# Patient Record
Sex: Female | Born: 1980 | Race: Black or African American | Hispanic: No | Marital: Single | Smoking: Never smoker
Health system: Southern US, Community
[De-identification: ages and names within clinical notes are randomized; demographics above are authoritative.]

## PROBLEM LIST (undated history)

## (undated) HISTORY — PX: NO PAST SURGERIES: SHX2092

---

## 2007-03-30 ENCOUNTER — Emergency Department (HOSPITAL_COMMUNITY): Admission: EM | Admit: 2007-03-30 | Discharge: 2007-03-30 | Payer: Self-pay | Admitting: Emergency Medicine

## 2007-04-15 ENCOUNTER — Ambulatory Visit (HOSPITAL_COMMUNITY): Admission: RE | Admit: 2007-04-15 | Discharge: 2007-04-15 | Payer: Self-pay | Admitting: Obstetrics & Gynecology

## 2007-05-27 ENCOUNTER — Ambulatory Visit (HOSPITAL_COMMUNITY): Admission: RE | Admit: 2007-05-27 | Discharge: 2007-05-27 | Payer: Self-pay | Admitting: Obstetrics & Gynecology

## 2007-08-24 ENCOUNTER — Ambulatory Visit (HOSPITAL_COMMUNITY): Admission: RE | Admit: 2007-08-24 | Discharge: 2007-08-24 | Payer: Self-pay | Admitting: Obstetrics

## 2007-10-25 ENCOUNTER — Inpatient Hospital Stay (HOSPITAL_COMMUNITY): Admission: AD | Admit: 2007-10-25 | Discharge: 2007-10-29 | Payer: Self-pay | Admitting: Obstetrics & Gynecology

## 2007-10-26 ENCOUNTER — Encounter (INDEPENDENT_AMBULATORY_CARE_PROVIDER_SITE_OTHER): Payer: Self-pay | Admitting: Obstetrics

## 2010-07-17 ENCOUNTER — Encounter: Admission: RE | Admit: 2010-07-17 | Discharge: 2010-07-17 | Payer: Self-pay | Admitting: Occupational Medicine

## 2010-08-28 ENCOUNTER — Emergency Department (HOSPITAL_COMMUNITY): Admission: EM | Admit: 2010-08-28 | Discharge: 2010-08-28 | Payer: Self-pay | Admitting: Emergency Medicine

## 2011-02-22 IMAGING — CR DG FOOT COMPLETE 3+V*L*
3 series · 3 of 3 positions shown · non-contrast
Comparison: None.

CLINICAL DATA: Fall with left foot pain.

LEFT FOOT - COMPLETE 3+ VIEW

[view not recorded (1 of 3)]
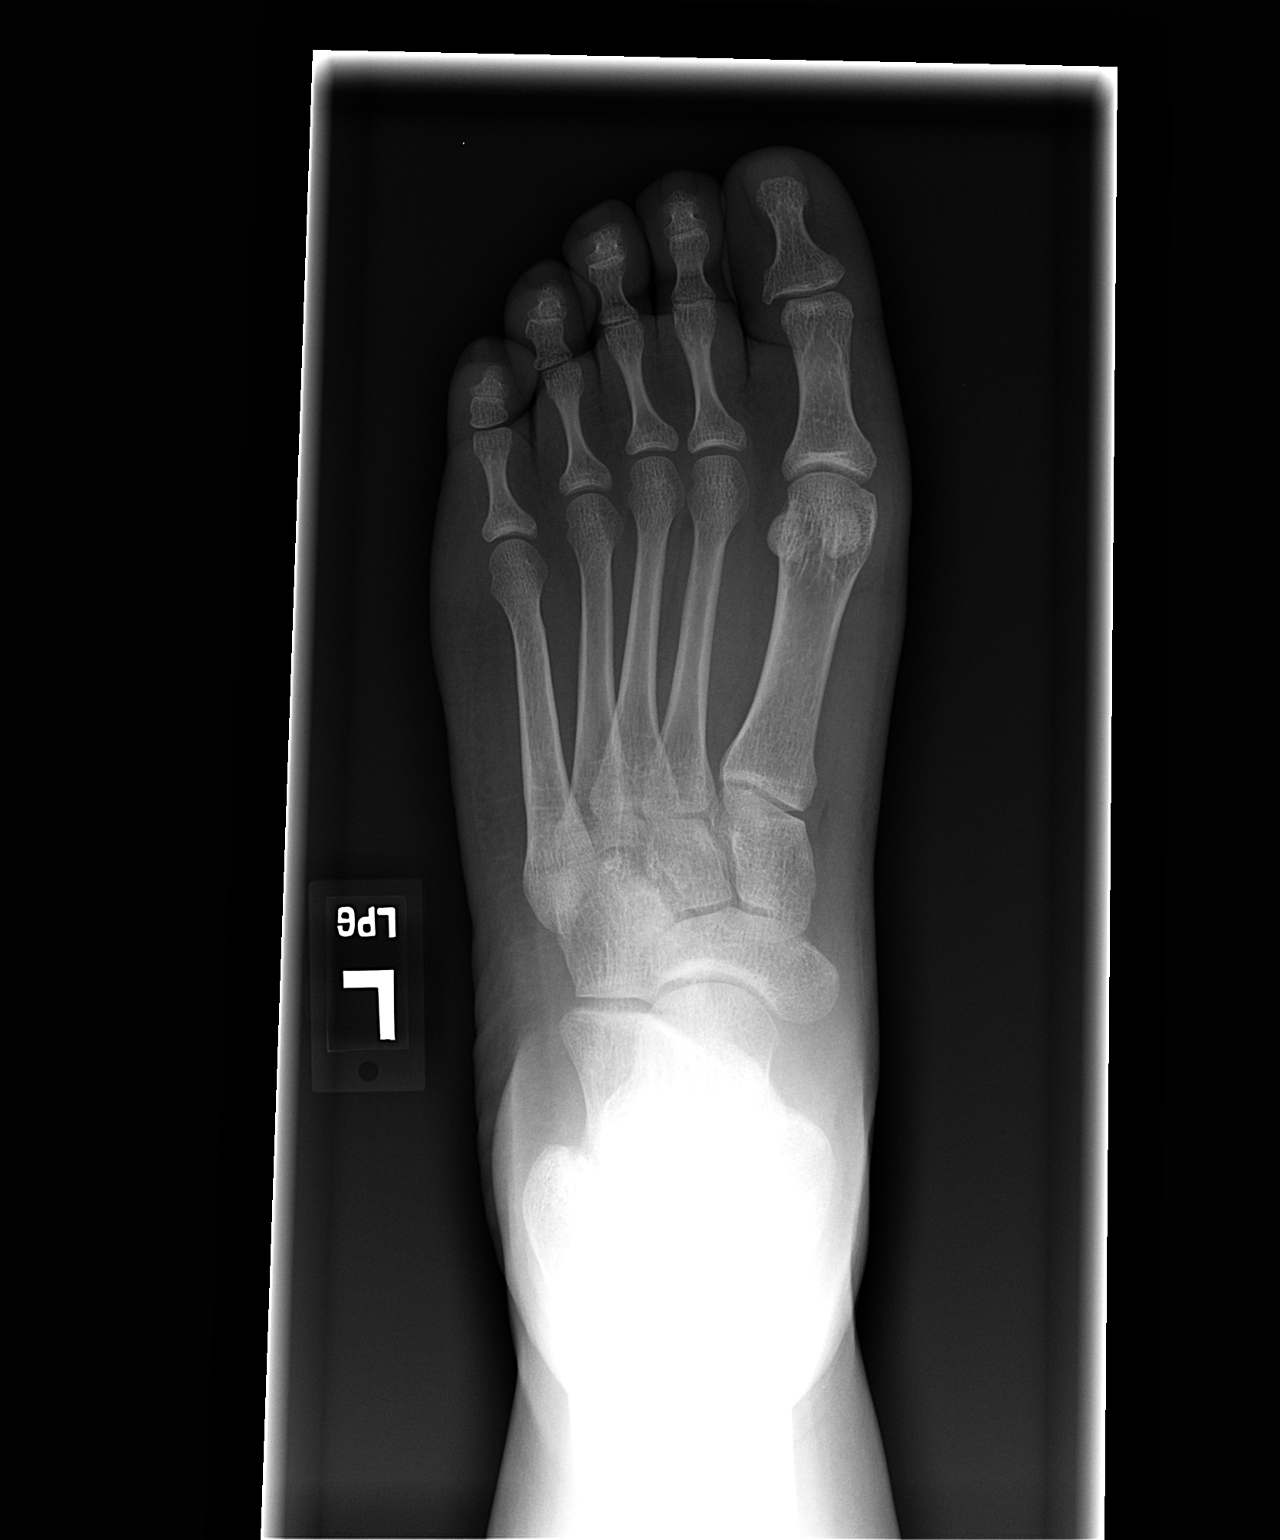

[view not recorded (2 of 3)]
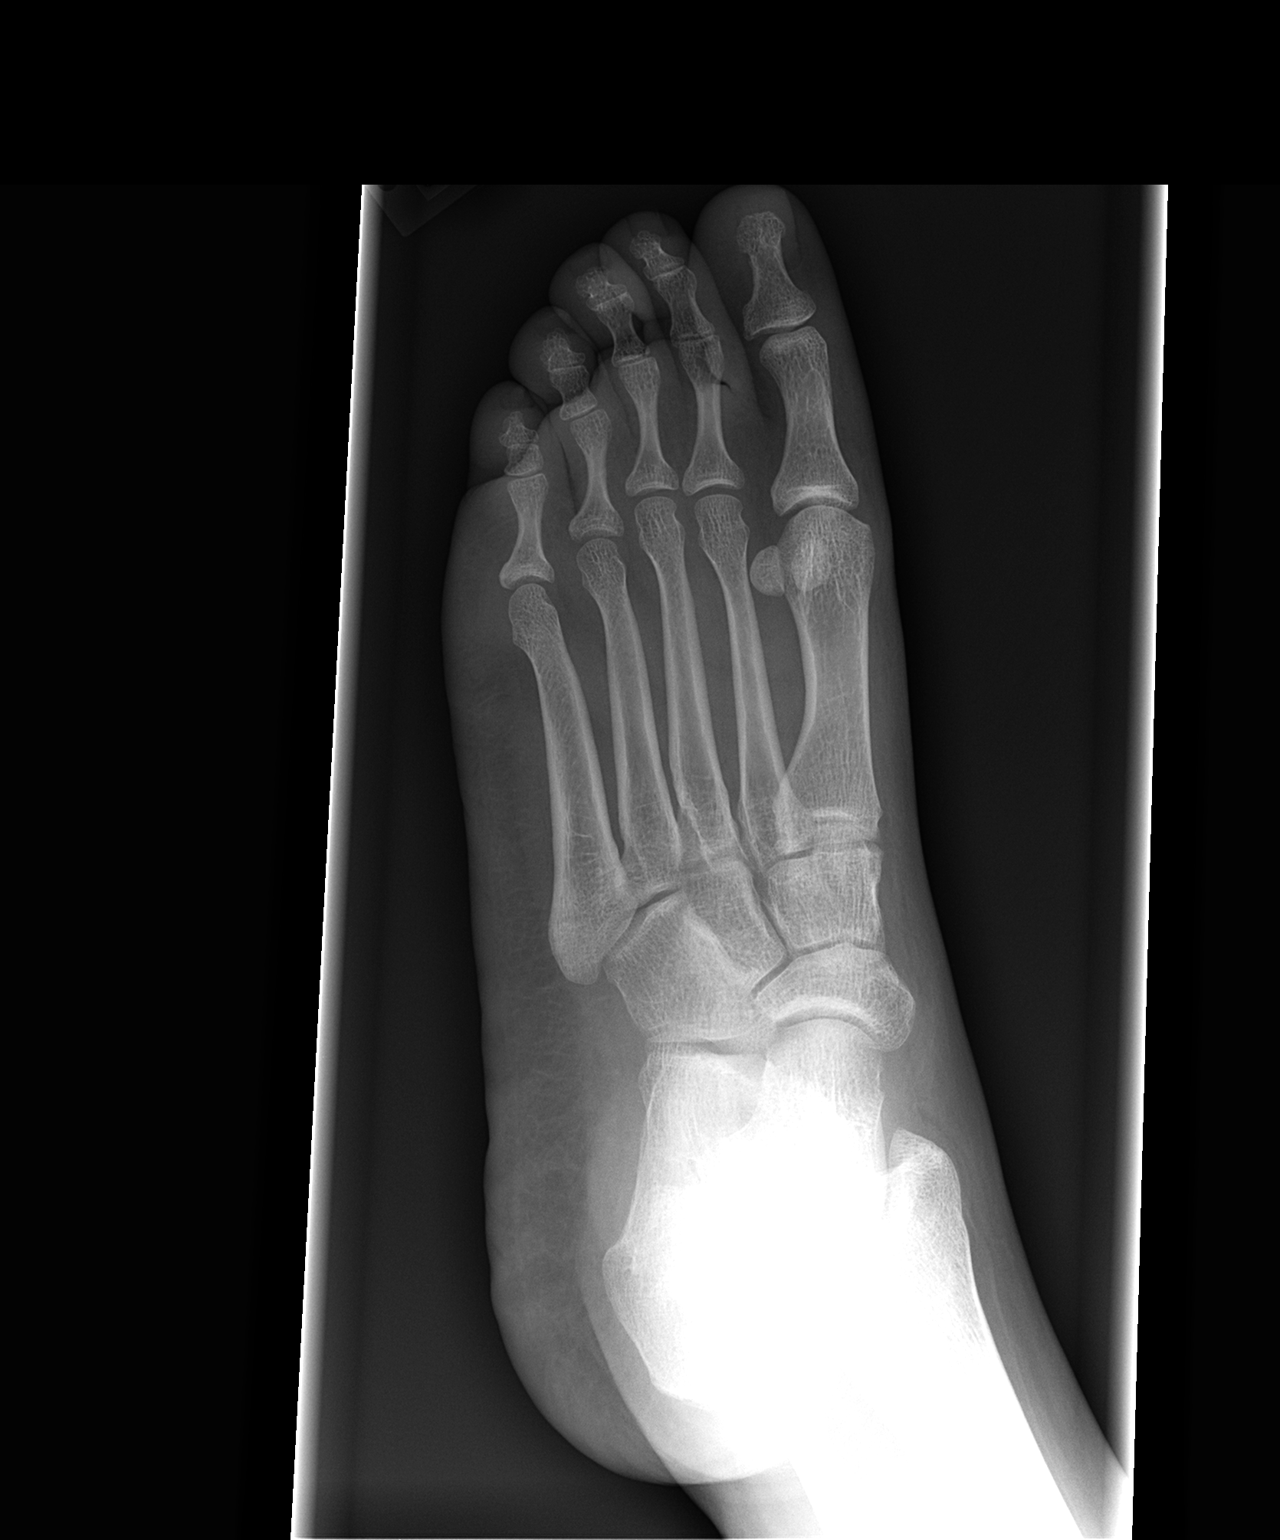

[view not recorded (3 of 3)]
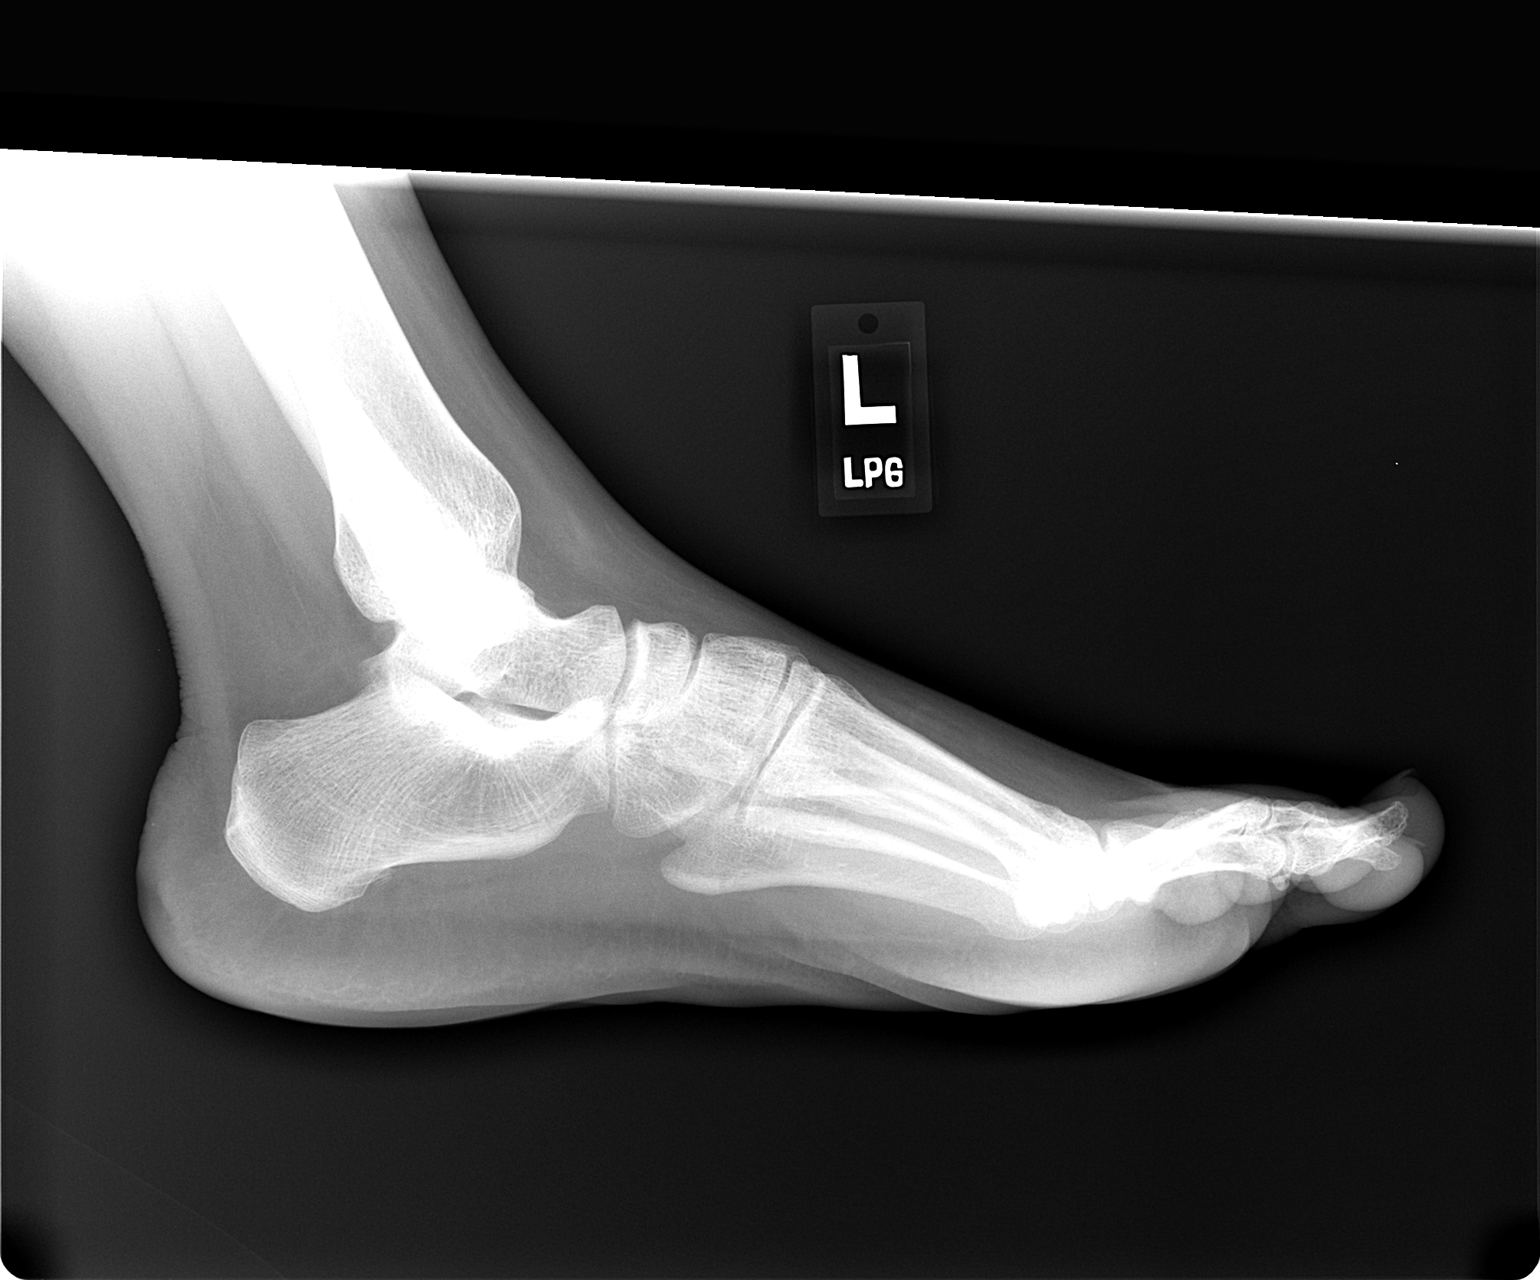

[3 of 3 positions shown; findings below may reference images not displayed]

FINDINGS: No evidence of acute fracture or joint abnormality.
IMPRESSION: No evidence of acute fracture.

## 2011-02-22 IMAGING — CR DG HIP (WITH OR WITHOUT PELVIS) 2-3V*L*
3 series · 3 of 3 positions shown · non-contrast
Comparison: None.

CLINICAL DATA: Fall with left hip pain.

LEFT HIP - COMPLETE 2+ VIEW

[view not recorded (1 of 3)]
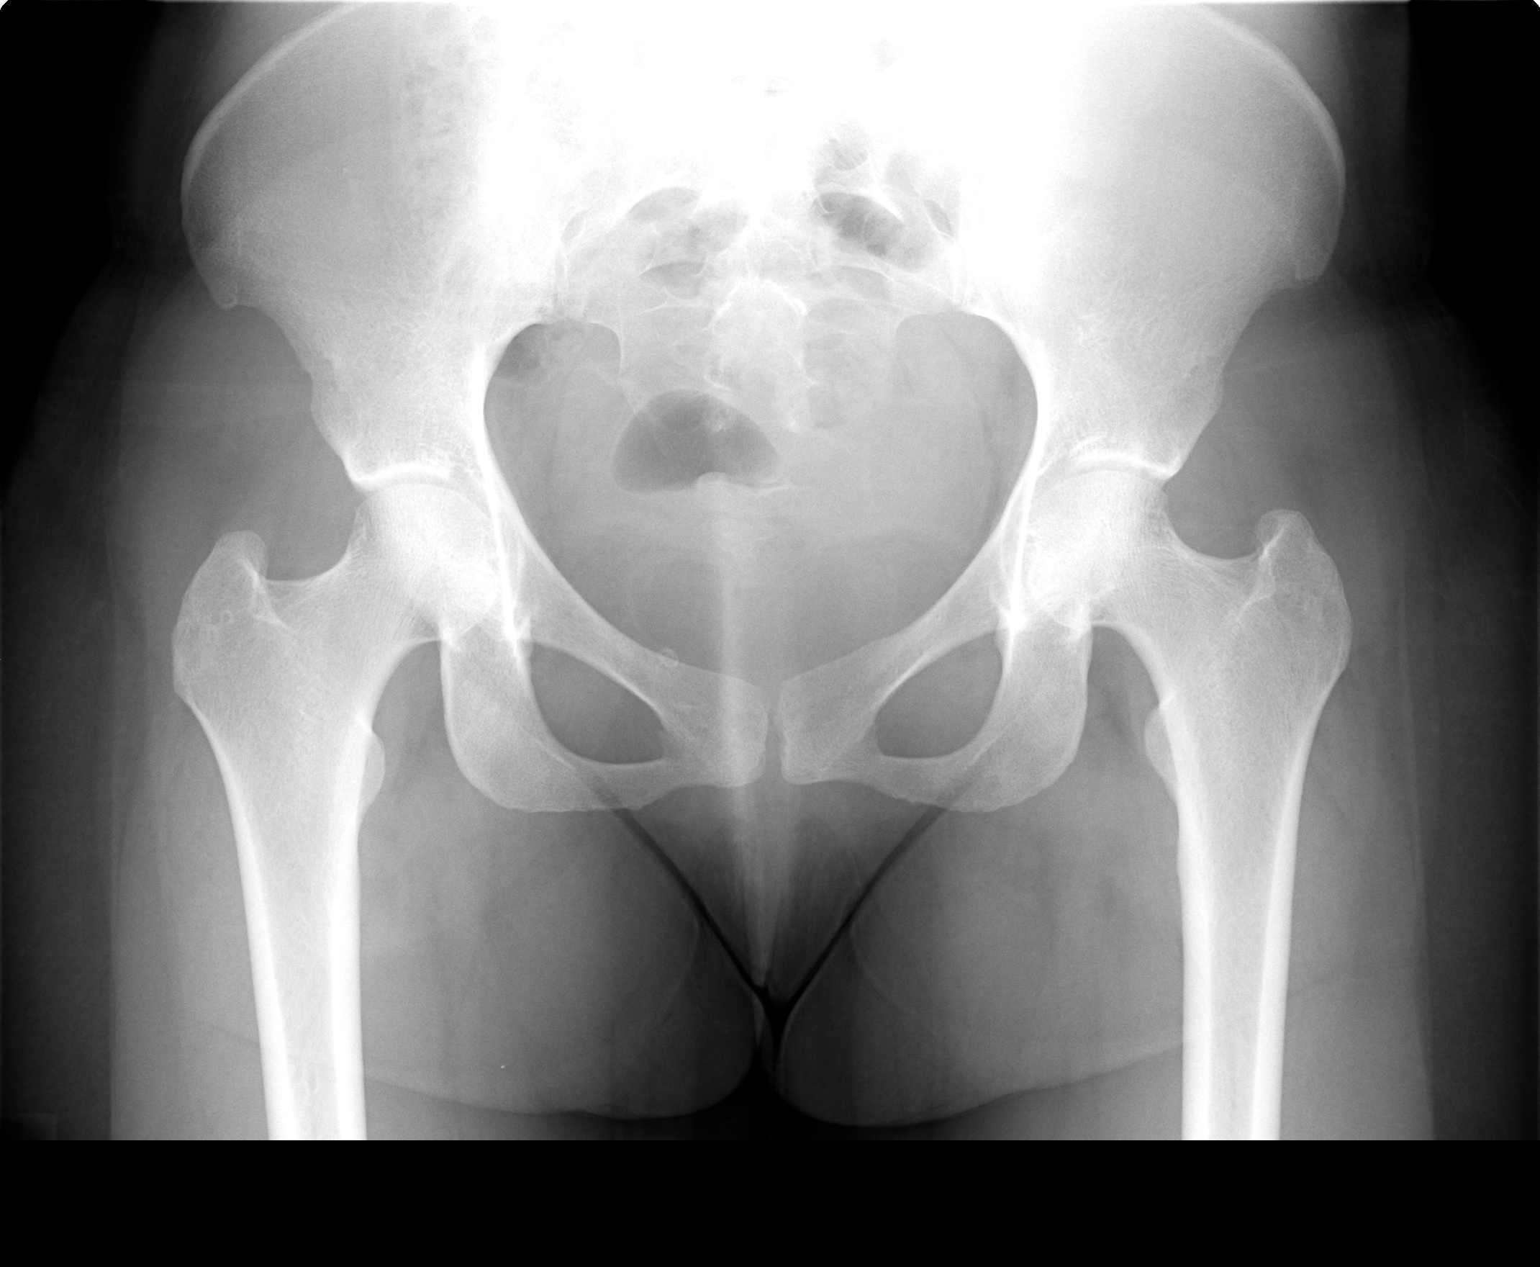

[view not recorded (2 of 3)]
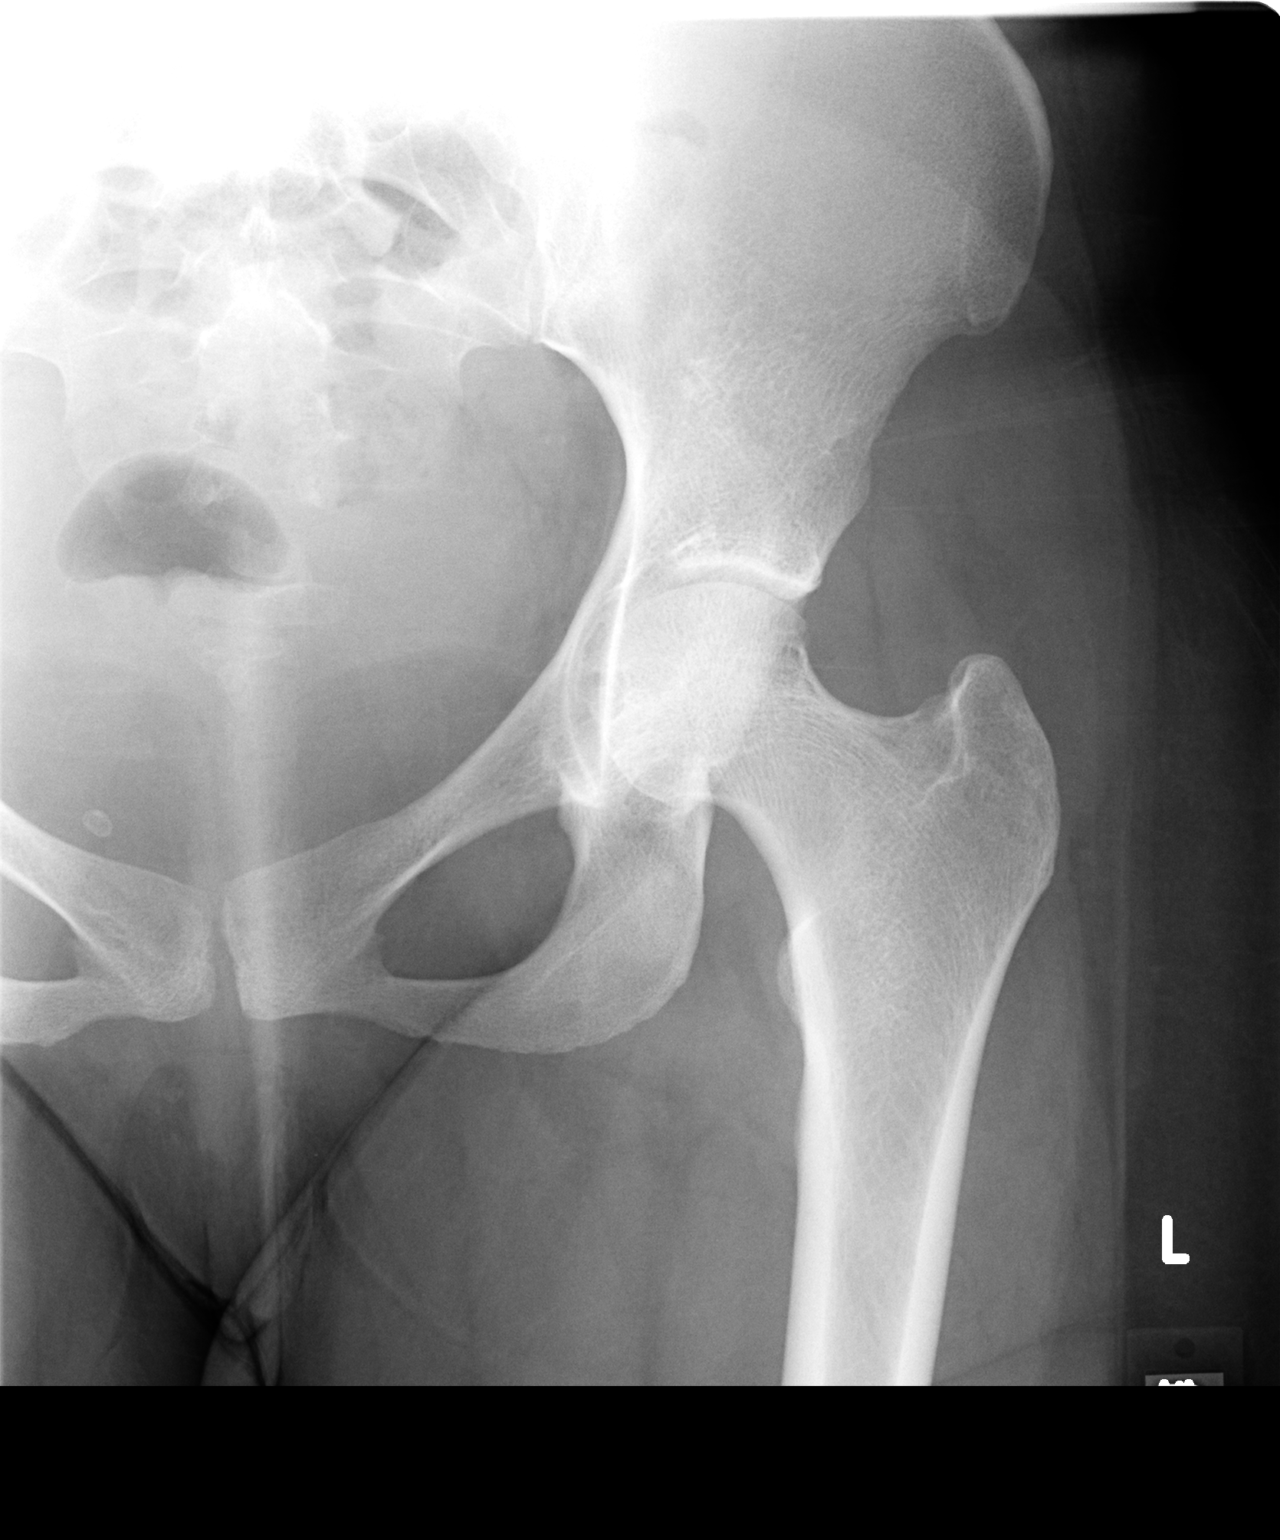

[view not recorded (3 of 3)]
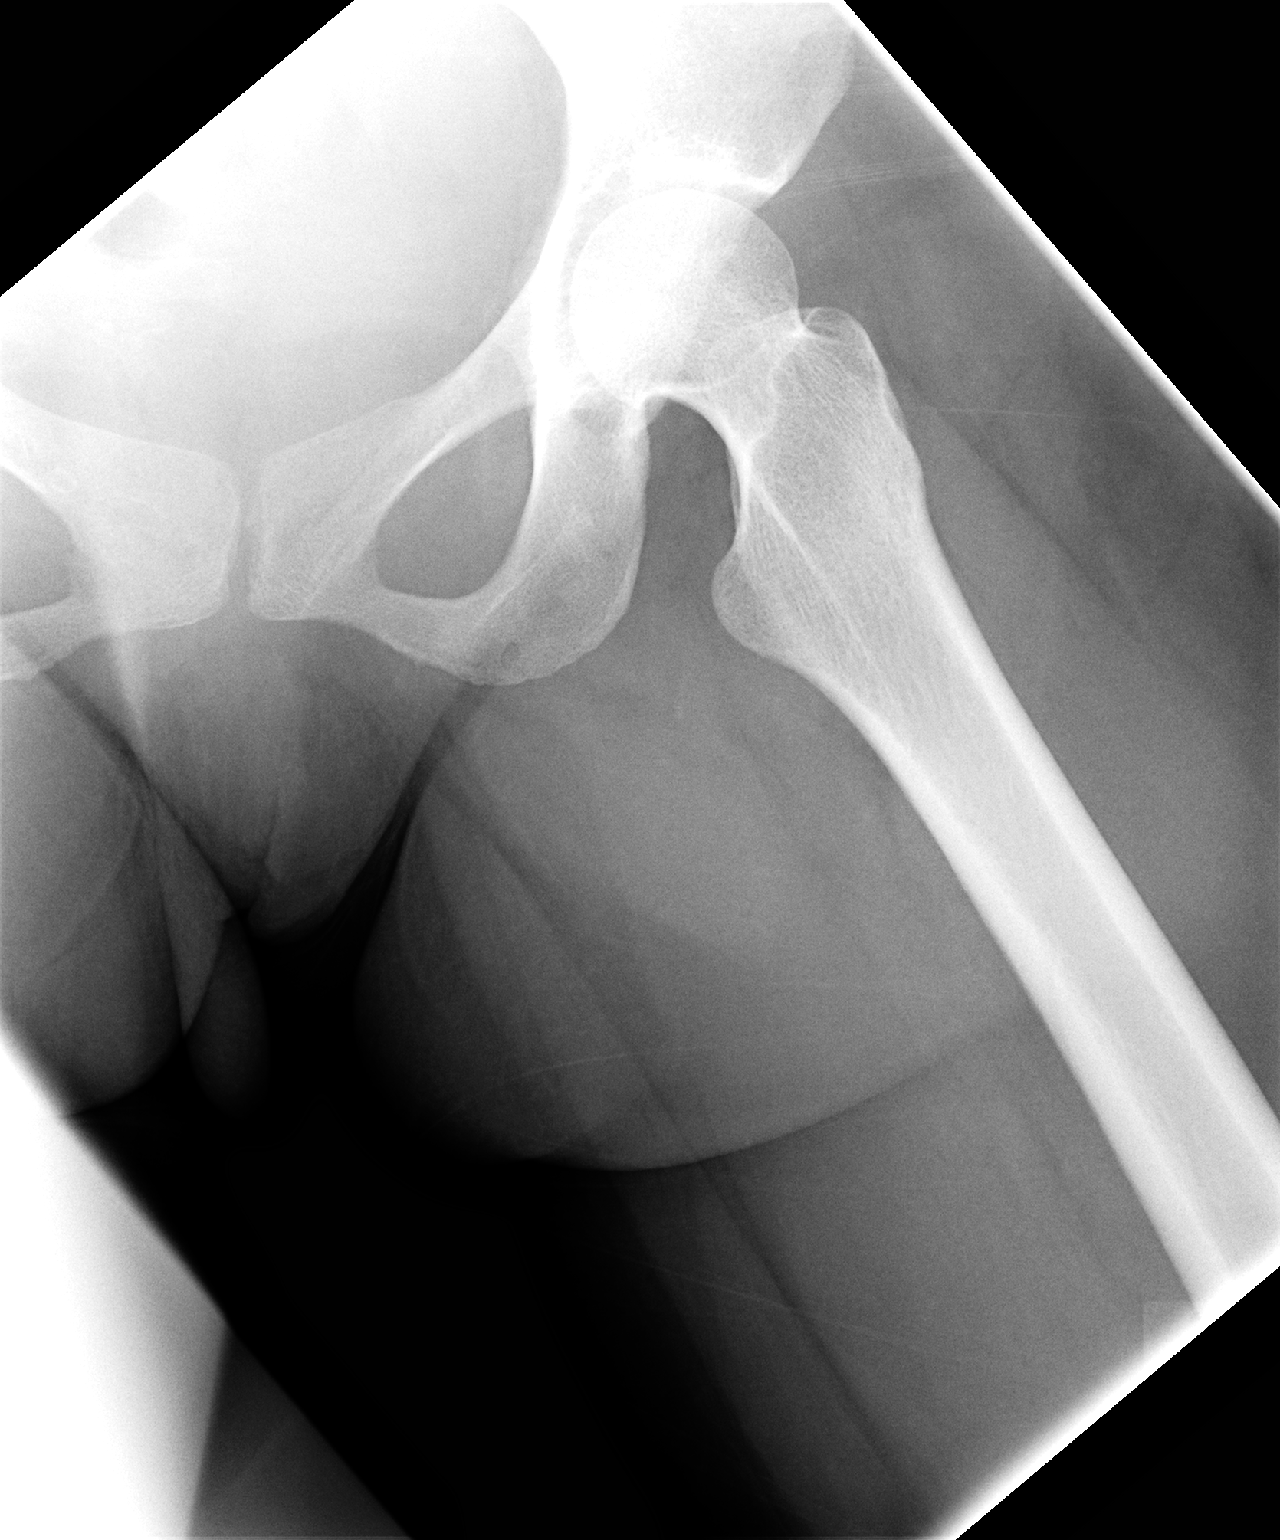

[3 of 3 positions shown; findings below may reference images not displayed]

FINDINGS: No fracture or dislocation.  No significant degenerative
changes.
IMPRESSION: No acute findings.

## 2011-08-15 LAB — CBC
HCT: 37.7
Hemoglobin: 10.8 — ABNORMAL LOW
Hemoglobin: 13.1
MCHC: 34.8
MCHC: 34.8
MCV: 87.3
Platelets: 230
RBC: 4.3
RDW: 13.4
RDW: 13.6
RDW: 13.6
WBC: 12.2 — ABNORMAL HIGH
WBC: 8.2

## 2011-08-15 LAB — COMPREHENSIVE METABOLIC PANEL
ALT: 15
Alkaline Phosphatase: 174 — ABNORMAL HIGH
CO2: 20
Calcium: 8.8
GFR calc non Af Amer: >60
Total Bilirubin: 1.1
Total Protein: 6.2

## 2011-08-15 LAB — URIC ACID: Uric Acid, Serum: 6.1

## 2011-08-15 LAB — RPR: RPR Ser Ql: NONREACTIVE

## 2012-08-14 ENCOUNTER — Ambulatory Visit (INDEPENDENT_AMBULATORY_CARE_PROVIDER_SITE_OTHER): Payer: 59 | Admitting: Family Medicine

## 2012-08-14 VITALS — BP 103/65 | HR 82 | Temp 98.4°F | Resp 16 | Ht 65.25 in | Wt 153.6 lb

## 2012-08-14 DIAGNOSIS — Z3490 Encounter for supervision of normal pregnancy, unspecified, unspecified trimester: Secondary | ICD-10-CM

## 2012-08-14 DIAGNOSIS — Z0000 Encounter for general adult medical examination without abnormal findings: Secondary | ICD-10-CM

## 2012-08-14 LAB — POCT GLYCOSYLATED HEMOGLOBIN (HGB A1C): Hemoglobin A1C: 4.9

## 2012-08-14 LAB — LIPID PANEL
Cholesterol: 218 mg/dL — ABNORMAL HIGH (ref 0–200)
HDL: 76 mg/dL (ref >39)
LDL Cholesterol: 128 mg/dL — ABNORMAL HIGH (ref 0–99)
Total CHOL/HDL Ratio: 2.9 Ratio
Triglycerides: 68 mg/dL (ref <150)
VLDL: 14 mg/dL (ref 0–40)

## 2012-08-14 NOTE — Progress Notes (Signed)
@[email or web address] @  Patient ID: Sheri Francis MRN: [id number], DOB: 1981-06-10, 31 y.o. Date of Encounter: 08/14/2012, 9:49 AM  Primary Physician: No primary provider on file.  Chief Complaint: Physical (CPE)  HPI: 31 y.o. y/o female with history of noted below here for CPE.  Doing well. No issues/complaints.  LMP: 16 weeks ago   Review of Systems: Consitutional: No fever, chills, fatigue, night sweats, lymphadenopathy, or weight changes. Eyes: No visual changes, eye redness, or discharge. ENT/Mouth: Ears: No otalgia, tinnitus, hearing loss, discharge. Nose: No congestion, rhinorrhea, sinus pain, or epistaxis. Throat: No sore throat, post nasal drip, or teeth pain. Cardiovascular: No CP, palpitations, diaphoresis, DOE, edema, orthopnea, PND. Respiratory: No cough, hemoptysis, SOB, or wheezing. Gastrointestinal: No anorexia, dysphagia, reflux, pain, nausea, vomiting, hematemesis, diarrhea, constipation, BRBPR, or melena. Breast: No discharge, pain, swelling, or mass. Genitourinary: No dysuria, frequency, urgency, hematuria, incontinence, nocturia,  vaginal discharge, pruritis, burning, abnormal bleeding, or pain. Musculoskeletal: No decreased ROM, myalgias, stiffness, joint swelling, or weakness. Skin: No rash, erythema, lesion changes, pain, warmth, jaundice, or pruritis. Neurological: No headache, dizziness, syncope, seizures, tremors, memory loss, coordination problems, or paresthesias. Psychological: No anxiety, depression, hallucinations, SI/HI. Endocrine: No fatigue, polydipsia, polyphagia, polyuria, or known diabetes. All other systems were reviewed and are otherwise negative.  No past medical history on file.   No past surgical history on file.  Home Meds:  Prior to Admission medications   Medication Sig Start Date End Date Taking? Authorizing Provider  Prenatal Vit-Fe Fumarate-FA (MULTIVITAMIN-PRENATAL) 27-0.8 MG TABS Take 1 tablet by mouth daily.   Yes Historical Provider, MD     Allergies: No Known Allergies  History   Social History  . Marital Status: Single    Spouse Name: N/A    Number of Children: N/A  . Years of Education: N/A   Occupational History  . Not on file.   Social History Main Topics  . Smoking status: Never Smoker   . Smokeless tobacco: Not on file  . Alcohol Use: Not on file  . Drug Use: Not on file  . Sexually Active: Not on file   Other Topics Concern  . Not on file   Social History Narrative  . No narrative on file    No family history on file.  Physical Exam: Blood pressure 103/65, pulse 82, temperature 98.4 F (36.9 C), temperature source Oral, resp. rate 16, height 5' 5.25" (1.657 m), weight 153 lb 9.6 oz (69.673 kg), SpO2 100.00%., Body mass index is 25.37 kg/(m^2). General: Well developed, well nourished, in no acute distress. HEENT: Normocephalic, atraumatic. Conjunctiva pink, sclera non-icteric. Pupils 2 mm constricting to 1 mm, round, regular, and equally reactive to light and accomodation. EOMI. Internal auditory canal clear. TMs with good cone of light and without pathology. Nasal mucosa pink. Nares are without discharge. No sinus tenderness. Oral mucosa pink. Dentition normal. Pharynx without exudate.   Neck: Supple. Trachea midline. No thyromegaly. Full ROM. No lymphadenopathy. Lungs: Clear to auscultation bilaterally without wheezes, rales, or rhonchi. Breathing is of normal effort and unlabored. Cardiovascular: RRR with S1 S2. No murmurs, rubs, or gallops appreciated. Distal pulses 2+ symmetrically. No carotid or abdominal bruits. Abdomen: Soft, non-tender, non-distended with normoactive bowel sounds. No hepatosplenomegaly or masses. No rebound/guarding. No CVA tenderness. Without hernias.  Musculoskeletal: Full range of motion and 5/5 strength throughout. Without swelling, atrophy, tenderness, crepitus, or warmth. Extremities without clubbing, cyanosis, or edema. Calves supple. Skin: Warm and moist without  erythema, ecchymosis, wounds, or rash. Neuro: A+Ox3. CN II-XII grossly  intact. Moves all extremities spontaneously. Full sensation throughout. Normal gait. DTR 2+ throughout upper and lower extremities. Finger to nose intact. Psych:  Responds to questions appropriately with a normal affect.    Assessment/Plan:  31 y.o. y/o female here for CPE 1. Medicare annual wellness visit, subsequent  POCT glycosylated hemoglobin (Hb A1C), Lipid panel, Nicotine screen, urine    -  Signed, Elvina Sidle, MD 08/14/2012 9:49 AM

## 2012-08-16 LAB — NICOTINE/COTININE METABOLITES: Cotinine: <10 ng/mL

## 2012-08-19 LAB — OB RESULTS CONSOLE ANTIBODY SCREEN: Antibody Screen: NEGATIVE

## 2012-08-19 LAB — OB RESULTS CONSOLE ABO/RH: RH Type: POSITIVE

## 2012-08-19 LAB — OB RESULTS CONSOLE GC/CHLAMYDIA: Gonorrhea: NEGATIVE

## 2012-09-09 DIAGNOSIS — Z3480 Encounter for supervision of other normal pregnancy, unspecified trimester: Secondary | ICD-10-CM

## 2012-09-10 ENCOUNTER — Other Ambulatory Visit: Payer: Self-pay | Admitting: Obstetrics

## 2012-09-10 DIAGNOSIS — Z369 Encounter for antenatal screening, unspecified: Secondary | ICD-10-CM

## 2012-09-10 DIAGNOSIS — Z3689 Encounter for other specified antenatal screening: Secondary | ICD-10-CM

## 2012-10-04 ENCOUNTER — Ambulatory Visit (HOSPITAL_COMMUNITY): Payer: 59

## 2012-11-10 NOTE — L&D Delivery Note (Signed)
Delivery Note At 9:40 AM a viable female was delivered via Vaginal, Spontaneous Delivery (Presentation: Right Occiput Anterior).  APGAR: 8, 9; weight: 3371 gms. .   Placenta status: Intact, Spontaneous.  Cord: 3 vessels with the following complications: None.  Cord pH: none  Anesthesia: Epidural  Episiotomy: None Lacerations: 2nd degree Suture Repair: 2.0 chromic Est. Blood Loss (mL): 400  Mom to postpartum.  Baby to nursery-stable.  HARPER,CHARLES A 02/28/2013, 10:29 AM

## 2013-01-27 ENCOUNTER — Encounter: Payer: Self-pay | Admitting: Obstetrics

## 2013-01-27 ENCOUNTER — Ambulatory Visit (INDEPENDENT_AMBULATORY_CARE_PROVIDER_SITE_OTHER): Payer: Self-pay | Admitting: Obstetrics

## 2013-01-27 VITALS — BP 124/74 | Temp 98.3°F | Wt 183.6 lb

## 2013-01-27 DIAGNOSIS — Z3483 Encounter for supervision of other normal pregnancy, third trimester: Secondary | ICD-10-CM

## 2013-01-27 LAB — POCT URINALYSIS DIPSTICK
Ketones, UA: NEGATIVE
Nitrite, UA: NEGATIVE
Protein, UA: NEGATIVE
Spec Grav, UA: 1.015
Urobilinogen, UA: NEGATIVE
pH, UA: 6

## 2013-01-27 NOTE — Addendum Note (Signed)
Addended by: Glendell Docker on: 01/27/2013 04:12 PM   Modules accepted: Orders

## 2013-01-27 NOTE — Progress Notes (Signed)
Pulse- 94 

## 2013-01-27 NOTE — Progress Notes (Signed)
Doing well. No complaints.

## 2013-02-01 ENCOUNTER — Encounter: Payer: Self-pay | Admitting: *Deleted

## 2013-02-01 NOTE — Progress Notes (Signed)
This encounter was created in error - please disregard.

## 2013-02-02 ENCOUNTER — Ambulatory Visit (INDEPENDENT_AMBULATORY_CARE_PROVIDER_SITE_OTHER): Payer: 59 | Admitting: Obstetrics

## 2013-02-02 VITALS — BP 101/55 | Temp 97.7°F | Wt 186.0 lb

## 2013-02-02 DIAGNOSIS — Z3480 Encounter for supervision of other normal pregnancy, unspecified trimester: Secondary | ICD-10-CM

## 2013-02-02 LAB — POCT URINALYSIS DIPSTICK
Glucose, UA: NEGATIVE
Leukocytes, UA: NEGATIVE
Nitrite, UA: NEGATIVE

## 2013-02-02 NOTE — Progress Notes (Signed)
Pulse: 86

## 2013-02-02 NOTE — Progress Notes (Signed)
No complaints. Doing well.

## 2013-02-10 ENCOUNTER — Ambulatory Visit (INDEPENDENT_AMBULATORY_CARE_PROVIDER_SITE_OTHER): Payer: 59 | Admitting: Obstetrics

## 2013-02-10 VITALS — BP 106/66 | Temp 98.6°F | Wt 188.0 lb

## 2013-02-10 DIAGNOSIS — J309 Allergic rhinitis, unspecified: Secondary | ICD-10-CM

## 2013-02-10 DIAGNOSIS — K219 Gastro-esophageal reflux disease without esophagitis: Secondary | ICD-10-CM

## 2013-02-10 DIAGNOSIS — J302 Other seasonal allergic rhinitis: Secondary | ICD-10-CM

## 2013-02-10 DIAGNOSIS — Z3480 Encounter for supervision of other normal pregnancy, unspecified trimester: Secondary | ICD-10-CM

## 2013-02-10 LAB — POCT URINALYSIS DIPSTICK
Bilirubin, UA: NEGATIVE
Glucose, UA: NEGATIVE
Nitrite, UA: NEGATIVE
Urobilinogen, UA: NEGATIVE
pH, UA: 7

## 2013-02-10 MED ORDER — LORATADINE 10 MG PO TABS: 10.0000 mg | ORAL_TABLET | Freq: Every day | ORAL | Status: DC

## 2013-02-10 MED ORDER — OMEPRAZOLE 20 MG PO CPDR: 20.0000 mg | DELAYED_RELEASE_CAPSULE | Freq: Every day | ORAL | Status: DC

## 2013-02-10 NOTE — Progress Notes (Signed)
Heart burn

## 2013-02-10 NOTE — Progress Notes (Signed)
Pulse-84 No complaints 

## 2013-02-15 ENCOUNTER — Encounter: Payer: Self-pay | Admitting: Obstetrics

## 2013-02-16 ENCOUNTER — Encounter: Payer: 59 | Admitting: Obstetrics & Gynecology

## 2013-02-16 ENCOUNTER — Encounter: Payer: Self-pay | Admitting: Obstetrics

## 2013-02-16 ENCOUNTER — Ambulatory Visit (INDEPENDENT_AMBULATORY_CARE_PROVIDER_SITE_OTHER): Payer: 59 | Admitting: Obstetrics

## 2013-02-16 VITALS — BP 104/70 | Temp 97.8°F | Wt 188.4 lb

## 2013-02-16 DIAGNOSIS — Z3483 Encounter for supervision of other normal pregnancy, third trimester: Secondary | ICD-10-CM

## 2013-02-16 DIAGNOSIS — Z3480 Encounter for supervision of other normal pregnancy, unspecified trimester: Secondary | ICD-10-CM

## 2013-02-16 LAB — POCT URINALYSIS DIPSTICK
Bilirubin, UA: NEGATIVE
Glucose, UA: NEGATIVE
Ketones, UA: NEGATIVE
pH, UA: 6

## 2013-02-16 NOTE — Progress Notes (Signed)
Pulse- 90.  Pressure.

## 2013-02-16 NOTE — Progress Notes (Signed)
Doing well 

## 2013-02-24 ENCOUNTER — Ambulatory Visit (INDEPENDENT_AMBULATORY_CARE_PROVIDER_SITE_OTHER): Payer: 59 | Admitting: Obstetrics

## 2013-02-24 VITALS — BP 112/70 | Temp 97.5°F | Wt 191.0 lb

## 2013-02-24 DIAGNOSIS — Z3480 Encounter for supervision of other normal pregnancy, unspecified trimester: Secondary | ICD-10-CM

## 2013-02-24 DIAGNOSIS — Z3483 Encounter for supervision of other normal pregnancy, third trimester: Secondary | ICD-10-CM

## 2013-02-24 LAB — POCT URINALYSIS DIPSTICK
Blood, UA: NEGATIVE
Ketones, UA: NEGATIVE
Leukocytes, UA: NEGATIVE
Nitrite, UA: NEGATIVE
Protein, UA: NEGATIVE
Urobilinogen, UA: NEGATIVE

## 2013-02-24 NOTE — Progress Notes (Signed)
P 97 Patient is due tomorrow- no complaints at this time.

## 2013-02-26 ENCOUNTER — Encounter: Payer: Self-pay | Admitting: Obstetrics

## 2013-02-26 NOTE — Patient Instructions (Signed)
Labor

## 2013-02-28 ENCOUNTER — Encounter (HOSPITAL_COMMUNITY): Payer: Self-pay | Admitting: Obstetrics

## 2013-02-28 ENCOUNTER — Inpatient Hospital Stay (HOSPITAL_COMMUNITY): Payer: Medicaid Other | Admitting: Anesthesiology

## 2013-02-28 ENCOUNTER — Encounter (HOSPITAL_COMMUNITY): Payer: Self-pay | Admitting: Anesthesiology

## 2013-02-28 ENCOUNTER — Encounter: Payer: Self-pay | Admitting: Obstetrics

## 2013-02-28 ENCOUNTER — Inpatient Hospital Stay (HOSPITAL_COMMUNITY)
Admission: AD | Admit: 2013-02-28 | Discharge: 2013-03-02 | DRG: 775 | Disposition: A | Discharge status: Home / Self Care | Payer: Medicaid Other | Source: Ambulatory Visit | Attending: Obstetrics | Admitting: Obstetrics

## 2013-02-28 LAB — CBC
Hemoglobin: 12.8 g/dL (ref 12.0–15.0)
MCHC: 35.7 g/dL (ref 30.0–36.0)
Platelets: 202 10*3/uL (ref 150–400)
RBC: 4.39 MIL/uL (ref 3.87–5.11)

## 2013-02-28 LAB — RPR: RPR Ser Ql: NONREACTIVE

## 2013-02-28 MED ORDER — DIBUCAINE 1 % RE OINT: 1.0000 "application " | TOPICAL_OINTMENT | RECTAL | Status: DC | PRN

## 2013-02-28 MED ORDER — ONDANSETRON HCL 4 MG/2ML IJ SOLN: 4.0000 mg | Freq: Four times a day (QID) | INTRAMUSCULAR | Status: DC | PRN

## 2013-02-28 MED ORDER — TETANUS-DIPHTH-ACELL PERTUSSIS 5-2.5-18.5 LF-MCG/0.5 IM SUSP
0.5000 mL | Freq: Once | INTRAMUSCULAR | Status: AC
  Administered 2013-03-02: 0.5 mL via INTRAMUSCULAR

## 2013-02-28 MED ORDER — ONDANSETRON HCL 4 MG/2ML IJ SOLN: 4.0000 mg | INTRAMUSCULAR | Status: DC | PRN

## 2013-02-28 MED ORDER — LACTATED RINGERS IV SOLN
500.0000 mL | Freq: Once | INTRAVENOUS | Status: AC
  Administered 2013-02-28: 500 mL via INTRAVENOUS

## 2013-02-28 MED ORDER — ZOLPIDEM TARTRATE 5 MG PO TABS: 5.0000 mg | ORAL_TABLET | Freq: Every evening | ORAL | Status: DC | PRN

## 2013-02-28 MED ORDER — WITCH HAZEL-GLYCERIN EX PADS: 1.0000 "application " | MEDICATED_PAD | CUTANEOUS | Status: DC | PRN

## 2013-02-28 MED ORDER — LIDOCAINE HCL (PF) 1 % IJ SOLN
INTRAMUSCULAR | Status: DC | PRN
  Administered 2013-02-28: 3 mL
  Administered 2013-02-28 (×2): 5 mL

## 2013-02-28 MED ORDER — CITRIC ACID-SODIUM CITRATE 334-500 MG/5ML PO SOLN: 30.0000 mL | ORAL | Status: DC | PRN

## 2013-02-28 MED ORDER — IBUPROFEN 600 MG PO TABS
600.0000 mg | ORAL_TABLET | Freq: Four times a day (QID) | ORAL | Status: DC
  Administered 2013-02-28 – 2013-03-02 (×9): 600 mg via ORAL
  Filled 2013-02-28 (×9): qty 1

## 2013-02-28 MED ORDER — SIMETHICONE 80 MG PO CHEW: 80.0000 mg | CHEWABLE_TABLET | ORAL | Status: DC | PRN

## 2013-02-28 MED ORDER — LACTATED RINGERS IV SOLN
INTRAVENOUS | Status: DC
  Administered 2013-02-28: 06:00:00 via INTRAVENOUS

## 2013-02-28 MED ORDER — DIPHENHYDRAMINE HCL 25 MG PO CAPS: 25.0000 mg | ORAL_CAPSULE | Freq: Four times a day (QID) | ORAL | Status: DC | PRN

## 2013-02-28 MED ORDER — EPHEDRINE 5 MG/ML INJ
10.0000 mg | INTRAVENOUS | Status: DC | PRN
  Filled 2013-02-28: qty 2

## 2013-02-28 MED ORDER — IBUPROFEN 600 MG PO TABS: 600.0000 mg | ORAL_TABLET | Freq: Four times a day (QID) | ORAL | Status: DC | PRN

## 2013-02-28 MED ORDER — PHENYLEPHRINE 40 MCG/ML (10ML) SYRINGE FOR IV PUSH (FOR BLOOD PRESSURE SUPPORT)
80.0000 ug | PREFILLED_SYRINGE | INTRAVENOUS | Status: DC | PRN
  Filled 2013-02-28: qty 2
  Filled 2013-02-28: qty 5

## 2013-02-28 MED ORDER — SENNOSIDES-DOCUSATE SODIUM 8.6-50 MG PO TABS
2.0000 | ORAL_TABLET | Freq: Every day | ORAL | Status: DC
  Administered 2013-03-01: 2 via ORAL

## 2013-02-28 MED ORDER — ONDANSETRON HCL 4 MG PO TABS: 4.0000 mg | ORAL_TABLET | ORAL | Status: DC | PRN

## 2013-02-28 MED ORDER — FLEET ENEMA 7-19 GM/118ML RE ENEM: 1.0000 | ENEMA | RECTAL | Status: DC | PRN

## 2013-02-28 MED ORDER — FENTANYL 2.5 MCG/ML BUPIVACAINE 1/10 % EPIDURAL INFUSION (WH - ANES)
14.0000 mL/h | INTRAMUSCULAR | Status: DC | PRN
  Filled 2013-02-28: qty 125

## 2013-02-28 MED ORDER — DIPHENHYDRAMINE HCL 50 MG/ML IJ SOLN: 12.5000 mg | INTRAMUSCULAR | Status: DC | PRN

## 2013-02-28 MED ORDER — OXYCODONE-ACETAMINOPHEN 5-325 MG PO TABS
1.0000 | ORAL_TABLET | ORAL | Status: DC | PRN
  Administered 2013-03-02: 2 via ORAL
  Filled 2013-02-28: qty 2

## 2013-02-28 MED ORDER — PHENYLEPHRINE 40 MCG/ML (10ML) SYRINGE FOR IV PUSH (FOR BLOOD PRESSURE SUPPORT)
80.0000 ug | PREFILLED_SYRINGE | INTRAVENOUS | Status: DC | PRN
  Filled 2013-02-28: qty 2

## 2013-02-28 MED ORDER — ACETAMINOPHEN 325 MG PO TABS: 650.0000 mg | ORAL_TABLET | ORAL | Status: DC | PRN

## 2013-02-28 MED ORDER — LACTATED RINGERS IV SOLN: 500.0000 mL | INTRAVENOUS | Status: DC | PRN

## 2013-02-28 MED ORDER — OXYTOCIN BOLUS FROM INFUSION
500.0000 mL | INTRAVENOUS | Status: DC
  Administered 2013-02-28: 500 mL via INTRAVENOUS

## 2013-02-28 MED ORDER — PRENATAL MULTIVITAMIN CH
1.0000 | ORAL_TABLET | Freq: Every day | ORAL | Status: DC
  Administered 2013-02-28 – 2013-03-02 (×3): 1 via ORAL
  Filled 2013-02-28 (×3): qty 1

## 2013-02-28 MED ORDER — EPHEDRINE 5 MG/ML INJ
10.0000 mg | INTRAVENOUS | Status: DC | PRN
  Filled 2013-02-28: qty 4
  Filled 2013-02-28: qty 2

## 2013-02-28 MED ORDER — OXYTOCIN 40 UNITS IN LACTATED RINGERS INFUSION - SIMPLE MED: 62.5000 mL/h | INTRAVENOUS | Status: DC | PRN

## 2013-02-28 MED ORDER — FENTANYL 2.5 MCG/ML BUPIVACAINE 1/10 % EPIDURAL INFUSION (WH - ANES)
INTRAMUSCULAR | Status: DC | PRN
  Administered 2013-02-28: 14 mL/h via EPIDURAL

## 2013-02-28 MED ORDER — BENZOCAINE-MENTHOL 20-0.5 % EX AERO
1.0000 "application " | INHALATION_SPRAY | CUTANEOUS | Status: DC | PRN
  Administered 2013-02-28 – 2013-03-01 (×2): 1 via TOPICAL
  Filled 2013-02-28 (×2): qty 56

## 2013-02-28 MED ORDER — LANOLIN HYDROUS EX OINT: TOPICAL_OINTMENT | CUTANEOUS | Status: DC | PRN

## 2013-02-28 MED ORDER — OXYTOCIN 40 UNITS IN LACTATED RINGERS INFUSION - SIMPLE MED
62.5000 mL/h | INTRAVENOUS | Status: DC
  Filled 2013-02-28: qty 1000

## 2013-02-28 MED ORDER — OXYCODONE-ACETAMINOPHEN 5-325 MG PO TABS: 1.0000 | ORAL_TABLET | ORAL | Status: DC | PRN

## 2013-02-28 MED ORDER — LIDOCAINE HCL (PF) 1 % IJ SOLN
30.0000 mL | INTRAMUSCULAR | Status: AC | PRN
  Administered 2013-02-28: 30 mL via SUBCUTANEOUS
  Filled 2013-02-28 (×2): qty 30

## 2013-02-28 NOTE — MAU Note (Signed)
Pt presents uncomfortable, taken to room after triage, SVE 6-7, pt states GBS is negative, and no problems with pregnancy. Wants an epid. Inform pt will call dr Gaynell Face for admission orders. Pt states dr Clearance Coots told her he would deliver her, same reported to dr Gaynell Face. Dr Clearance Coots called  And states he will come for delivery.

## 2013-02-28 NOTE — MAU Note (Signed)
contractions 

## 2013-02-28 NOTE — H&P (Signed)
Sheri Francis is a 32 y.o. female presenting for UC's. Maternal Medical History:  Reason for admission: Contractions.  31 y o G2 P1.  EDC 02-25-13.  Presents with UC's  Contractions: Onset was 6-12 hours ago.    Fetal activity: Perceived fetal activity is normal.   Last perceived fetal movement was within the past hour.    Prenatal complications: no prenatal complications Prenatal Complications - Diabetes: none.    OB History   Grav Para Term Preterm Abortions TAB SAB Ect Mult Living   2 1 1       1      History reviewed. No pertinent past medical history. History reviewed. No pertinent past surgical history. Family History: family history is not on file. Social History:  reports that she has never smoked. She does not have any smokeless tobacco history on file. She reports that she does not drink alcohol or use illicit drugs.   Prenatal Transfer Tool  Maternal Diabetes: No Genetic Screening: Normal Maternal Ultrasounds/Referrals: Normal Fetal Ultrasounds or other Referrals:  None Maternal Substance Abuse:  No Significant Maternal Medications:  Meds include: Other:  Significant Maternal Lab Results:  Lab values include: Group B Strep negative Other Comments:  None  Review of Systems  All other systems reviewed and are negative.    Dilation: 6.5 Effacement (%): 100 Station: 0 Exam by:: K Fraley RN Blood pressure 146/89, pulse 92, resp. rate 18, height 5\' 5"  (1.651 m), weight 188 lb (85.276 kg), last menstrual period 05/21/2012, SpO2 100.00%. Maternal Exam:  Abdomen: Patient reports no abdominal tenderness. Fetal presentation: vertex  Introitus: Normal vulva. Normal vagina.  Pelvis: adequate for delivery.   Cervix: Cervix evaluated by digital exam.     Physical Exam  Nursing note and vitals reviewed. Constitutional: She is oriented to person, place, and time. She appears well-developed and well-nourished.  HENT:  Head: Normocephalic and atraumatic.  Eyes:  Conjunctivae are normal. Pupils are equal, round, and reactive to light.  Neck: Normal range of motion. Neck supple.  Cardiovascular: Normal rate and regular rhythm.   Respiratory: Effort normal.  GI: Soft.  Genitourinary: Vagina normal and uterus normal.  Musculoskeletal: Normal range of motion.  Neurological: She is alert and oriented to person, place, and time.  Skin: Skin is warm and dry.  Psychiatric: She has a normal mood and affect. Her behavior is normal. Judgment and thought content normal.    Prenatal labs: ABO, Rh: O/Positive/-- (10/10 0000) Antibody: Negative (10/10 0000) Rubella: Immune (10/10 0000) RPR: Nonreactive (10/10 0000)  HBsAg: Negative (10/10 0000)  HIV: Non-reactive (10/10 0000)  GBS: NEGATIVE (03/20 1612)   Assessment/Plan: 40.3 weeks.  Active labor.  Admit.   Rasheena Talmadge A 02/28/2013, 6:15 AM

## 2013-02-28 NOTE — Anesthesia Procedure Notes (Signed)
Epidural Patient location during procedure: OB  Staffing Anesthesiologist: Francelia Mclaren Performed by: anesthesiologist   Preanesthetic Checklist Completed: patient identified, site marked, surgical consent, pre-op evaluation, timeout performed, IV checked, risks and benefits discussed and monitors and equipment checked  Epidural Patient position: sitting Prep: ChloraPrep Patient monitoring: heart rate, continuous pulse ox and blood pressure Approach: right paramedian Injection technique: LOR saline  Needle:  Needle type: Tuohy  Needle gauge: 17 G Needle length: 9 cm and 9 Needle insertion depth: 7 cm Catheter type: closed end flexible Catheter size: 20 Guage Catheter at skin depth: 12 cm Test dose: negative  Assessment Events: blood not aspirated, injection not painful, no injection resistance, negative IV test and no paresthesia  Additional Notes   Patient tolerated the insertion well without complications.   

## 2013-02-28 NOTE — Anesthesia Preprocedure Evaluation (Signed)

## 2013-02-28 NOTE — Progress Notes (Signed)
Sheri Francis is a 32 y.o. G2P1001 at [redacted]w[redacted]d by LMP admitted for active labor  Subjective:   Objective: BP 146/89  Pulse 92  Resp 18  Ht 5\' 5"  (1.651 m)  Wt 188 lb (85.276 kg)  BMI 31.28 kg/m2  SpO2 100%  LMP 05/21/2012      FHT:  FHR: 150 bpm, variability: moderate,  accelerations:  Present,  decelerations:  Absent UC:   regular, every 3 minutes SVE:   Dilation: 6.5 Effacement (%): 100 Station: 0 Exam by:: Larose Kells RN  Labs: Lab Results  Component Value Date   WBC 11.0* 02/28/2013   HGB 12.8 02/28/2013   HCT 35.9* 02/28/2013   MCV 81.8 02/28/2013   PLT 202 02/28/2013    Assessment / Plan: Spontaneous labor, progressing normally  Labor: Progressing normally Preeclampsia:  n/a Fetal Wellbeing:  Category I Pain Control:  Epidural I/D:  n/a Anticipated MOD:  NSVD  Sheri Francis A 02/28/2013, 6:22 AM

## 2013-03-01 LAB — CBC
MCH: 29 pg (ref 26.0–34.0)
Platelets: 211 10*3/uL (ref 150–400)
RBC: 4.1 MIL/uL (ref 3.87–5.11)
WBC: 12.6 10*3/uL — ABNORMAL HIGH (ref 4.0–10.5)

## 2013-03-01 NOTE — Progress Notes (Signed)
UR chart review completed.  

## 2013-03-01 NOTE — Anesthesia Postprocedure Evaluation (Signed)
Anesthesia Post Note  Patient: Sheri Francis  Procedure(s) Performed: * No procedures listed *  Anesthesia type: Epidural  Patient location: Mother/Baby  Post pain: Pain level controlled  Post assessment: Post-op Vital signs reviewed  Last Vitals:  Filed Vitals:   03/01/13 0600  BP: 111/75  Pulse: 78  Temp: 36.8 C  Resp: 18    Post vital signs: Reviewed  Level of consciousness: awake  Complications: No apparent anesthesia complications

## 2013-03-01 NOTE — Progress Notes (Signed)
Post Partum Day 1 Subjective: no complaints  Objective: Blood pressure 120/70, pulse 88, temperature 98.7 F (37.1 C), temperature source Oral, resp. rate 18, height 5\' 5"  (1.651 m), weight 188 lb (85.276 kg), last menstrual period 05/21/2012, SpO2 100.00%, unknown if currently breastfeeding.  Physical Exam:  General: alert and no distress Lochia: appropriate Uterine Fundus: firm Incision: healing well DVT Evaluation: No evidence of DVT seen on physical exam.   Recent Labs  02/28/13 0555  HGB 12.8  HCT 35.9*    Assessment/Plan: Plan for discharge tomorrow   LOS: 1 day   HARPER,CHARLES A 03/01/2013, 6:21 AM

## 2013-03-02 MED ORDER — OXYCODONE-ACETAMINOPHEN 5-325 MG PO TABS: 1.0000 | ORAL_TABLET | ORAL | Status: DC | PRN

## 2013-03-02 MED ORDER — IBUPROFEN 600 MG PO TABS: 600.0000 mg | ORAL_TABLET | Freq: Four times a day (QID) | ORAL | Status: DC | PRN

## 2013-03-02 NOTE — Progress Notes (Signed)
Post Partum Day 2 Subjective: no complaints  Objective: Blood pressure 116/71, pulse 81, temperature 97.2 F (36.2 C), temperature source Oral, resp. rate 18, height 5\' 5"  (1.651 m), weight 188 lb (85.276 kg), last menstrual period 05/21/2012, SpO2 100.00%, unknown if currently breastfeeding.  Physical Exam:  General: alert and no distress Lochia: appropriate Uterine Fundus: firm Incision: healing well DVT Evaluation: No evidence of DVT seen on physical exam.   Recent Labs  02/28/13 0555 03/01/13 0550  HGB 12.8 11.9*  HCT 35.9* 33.7*    Assessment/Plan: Discharge home   LOS: 2 days   HARPER,CHARLES A 03/02/2013, 6:43 AM

## 2013-03-03 ENCOUNTER — Encounter: Payer: 59 | Admitting: Obstetrics

## 2013-03-03 NOTE — Discharge Instructions (Signed)
Caring for Common Problems of Your Infant at Home Call your clinic to make a follow-up visit for your infant as told by your caregiver. You should make an appointment for your baby to be seen at 62 weeks of age for a well baby visit, unless your caregiver wants to see you sooner. For appointments, bring:  A diaper and a change of clothes.  A bottle of formula if your baby is bottle-fed, or a bottle of water if your baby is breastfed. If you have questions, please write them down. Bring your list with you when the baby is examined. If something seems unusual or you are worried about a problem, call your caregiver. COMMON QUESTIONS What are the white spots on my baby's face? Both neonatal acne and milia are common in the newborns. Both conditions are normal for newborns, and both usually resolve on their own in 6 to 8 weeks.  What should I do for diaper rash? If there is diaper rash for more than 3 days, you can treat it with an over-the-counter cream, powder, or ointment for a fungal infection. If there is no improvement within 3 days, call your caregiver or make an appointment for your baby to be seen. How much pain medication should I give my baby? Do not give any medication to your baby until at least 21 weeks of age and then only after checking with your caregiver. What is fever? Fever in a newborn or infant younger than 2 months can be very serious. Call your doctor if:   Your baby is 41 months old or younger with a rectal temperature of 100.4 F (38 C) or higher.  Your baby is older than 3 months with a rectal temperature of 102 F (38.9 C) or higher.  You think your baby has a fever and you are not able to measure it. What are the white spots in my baby's mouth? It is common to see thrush in newborns. This condition is causing the white spots. This condition is not serious. The white spots are a very mild fungal infection that can be easily wiped off and treated with over-the-counter or  prescription medications.  SAFETY Accidents are the leading and most preventable cause of death for children. Consider these safety tips:  Your child should ride in a rear-facing car seat until your doctor tells you that your child can face forward. Be sure to have your seat checked to see if it is appropriately installed. Never allow your infant to ride in the front seat.  There should only be 2 3/8 inches (5.08 centimeters) between slats in your child's crib. An older crib may have spaces that are too big. The mattress should fit snugly so that your child will not get trapped between the crib and mattress. Do not place blankets or large stuffed animals in the crib that could smother your baby.  Always place your baby on his or her back to decrease risk of sudden infant death syndrome (SIDS). Vomiting In Infants and Newborns Forceful vomiting in newborns and young infants is not normal and may be serious, especially if associated with:  Fever (temperature greater than 100.12F [38C]).  Weight loss.  Irritability, decreased activity, or crying for a prolonged period.  Not eating.  Vomit that looks green or yellow (bilious) or is forceful.  Hunger after vomiting.  Signs of abdominal pain. If your baby is vomiting and has any of the above symptoms, call your caregiver. Most of the time vomiting is  not serious and may just represent gastroesophageal reflux disease (GERD). Gastroesophageal reflux disease is normal in newborn and infants, and your child may be what caregivers call "a happy spitter". This is when your baby painlessly and effortlessly spits up their milk but appears perfectly happy. This will gradually improve as your baby gets older. Most infants with GERD will gain weight appropriately and not develop any problems. If you are concerned about GERD affecting your baby, you can discuss the following lifestyle changes with your caregiver:  Changing formula.  Changing how you  position your baby when you place them down.  Breastfeeding.  Thickening your baby's feeds. Less commonly, vomiting in babies can be caused by an allergy to a protein in their formula called milk protein allergy. Most often newborns and infants with milk protein allergy are irritable and have bloody diarrhea, but they can also have vomiting.  Another condition called pyloric stenosis occurs in about 3 of every 1,000 births. In this condition infants have forceful or projectile vomit. Parents often describe this as vomit "shooting" across the room. Shortly after vomiting, infants appear to be very hungry. If your baby's belly appears swollen or you see what looks like a green or yellow fluid in the vomit, your baby could have twisted and blocked bowels. This requires prompt evaluation by your caregiver. Vomiting In Older Infants Vomiting and diarrhea in infants may occur with infections. The most common cause of vomiting in older infants is gastroenteritis, usually caused by a viral infection. However, a sore throat, ear infection, or bladder infection can also cause vomiting.  If your infant is between 6 months and 6 months old and suddenly gets crampy, abdominal pain and vomiting that progressively worsens, call your caregiver. Older infants are at risk for a type of intestinal obstruction (intussusception). Also, if vomiting is associated with a headache, you should discuss this with your caregiver. If vomiting or diarrhea occur in large amounts, and the baby is not taking enough fluids, the baby may lose too much body water and body salts and become dehydrated (loss of body fluids). Suspect dehydration if:   The eyes look sunken in.  The tongue and mouth are dry.  Diaper wetting decreases. Babies younger than 3 months require special care and close watching because they lose body water and become dehydrated a lot faster. True vomiting is when food is brought up from the stomach. This is  different from when babies "spit up" small amounts. The most common cause of diarrhea in infants is intolerance to the protein in the formula. It is most important to prevent dehydration in infants. Dehydration can come on quicker when there is both vomiting and diarrhea.  Diarrhea In Newborns And Older Infants Many parents often confuse diarrhea with normal baby stools. Normal baby stools are soft and loose. Your baby may have a stool after each feeding during the first 2 months of life, especially breastfed babies. As a result, determining what is diarrhea and what is normal baby stool can sometimes be hard for parents. Diarrhea is watery stools, not just one or two loose stools during the day but several.  You should be concerned about changes such as stools that are more frequent or watery. Realize that babies stools may change as a result of the use of antibiotics or a change in diet. Additionally, if you are breastfeeding, similar changes by you, such as changes in your diet, could affect your babies stools. As infants get older, diarrhea can be  caused the infections. The most common infection is caused by rotavirus for which there is now a vaccination. In young infants, the main concern is dehydration. If your baby is 16 months old or younger and you suspect he or she has has diarrhea, call your caregiver. Call your caregiver anytime you see pus or blood in your baby's stool or if your baby has fever and diarrhea last more than 3 days. What To Do Infants Breastfed babies have stools that are more loose than formula-fed babies. If your baby is breastfed and develops diarrhea, continue to breastfeed, unless your doctor tells you to stop, and monitor their urine output closely. If your baby urinates less often than normal or you have to change fewer diapers, call your caregiver. Breast milk is more easily digested than any other fluid and can be used for mild dehydration. You can breastfeed for 5 minutes  every 30 minutes. If your baby does not vomit for 2 hours go back to your regular schedule. Guidelines for replacing fluid: Replace any fluid lost through diarrhea or vomiting with  to  cup or 2 to 4 oz (60 to 120 ml) of oral rehydration solution (ORS) for each diarrheal stool or vomiting episode. If there is no vomiting for 2 hours go back to your normal feeding schedule. Older infants Older infants can continue to eat if they want to, as long as you monitor them for signs of dehydration. Encourage older infants to continue to drink fluids even if they do not want to eat but avoid giving them large amounts of any drinks with a high sugar content, including juices and soda. The best fluids are commercially available ORSs. Guidelines for replacing fluid: Replace any fluid lost through diarrhea or vomiting with ORS as follows:   If your baby weighs 22 lb or less (10 kg or less), give him or her  to  cup or 2 to 4 oz (60 to 120 ml) of ORS for each diarrheal stool or vomiting episode.  If your baby weighs more than more than 22 lb (10 kg), give him or her  to 1 cup or 4 to 8 oz (120 to 240 ml) of ORS for each diarrheal stool or vomiting episode. If your baby continues to vomit even these small amounts or continues to have diarrhea in spite of treatment, contact your caregiver. Colic All babies experience fussiness. Fussiness that occurs for an extended period or becomes uncontrollable is referred to as colic. Babies with colic will differ in the:  Amount of symptoms.  Duration of colic. The cause of colic is not known. Colic can occur in either breastfed or bottle-fed babies. It is usually worse in the late afternoon or evening. Colic often happens between 3 weeks and 3 months of life and usually goes away after that time. If your baby is 39 weeks old or younger and has colic symptoms talk with your caregiver. The cause of colic is unknown, but there may be several factors involved. A baby who  swallows a lot of air and does not burp easily may develop colicky symptoms. Colic also may be secondary to rapid feeding or over feeding. Sometimes a change your baby's diet, including formula, will cause him or her to have colic. Colic is not a serious medical condition. Your baby will eat, grow, and gain weight with no long term affects. Symptoms  Your baby may have facial redness (flushing), arch his or her back, pull his or her arms and legs  up to the belly, have a tense belly (abdomen), cry loudly with fists clenched, and generally seem irritable and fussy.  Usually there is no weight loss, fever, diarrhea, or vomiting.  Symptoms may last as long as 3 hours per day on more than 3 days of the week. Symptoms often occur at the same time of day.  Symptoms improve as he or she tires himself or herself out.  Symptoms generally begin to improve after 6 weeks.  If your baby is older than 3 months and symptoms continue, talk with your caregiver about other possible diagnoses.  Happy spitters do not benefit from medications for GERD. What Can Be Done About Colic?   Be sure your baby has burped after each feeding.  Provide a quiet, calm place for your baby. Avoid stress and tension. Many parents find holding their baby is an effective way to comfort him or her. Gentle rocking or swinging are also effective. Singing lullabies or playing music can soothe your baby. Pacifiers allow babies to suck, which can be comforting. Place your baby on his or her tummy and rub his or her back, but do not let him or her sleep on their belly.  Your caregiver might recommend changing formulas. Your caregiver may want you to change from an iron-fortified formula to a plain or soy bean-based formula.  Colic can be very frustrating and cause extra stress on the parents. It is important to get help and support from family and friends. It is important to find counseling if necessary. Be observant. Do you notice  symptoms after feeding or certain medications? Avoid overfeeding or feeding too quickly.  If the condition persists or if the child vomits, has a fever, diarrhea, or bloody stools, call your caregiver. Medication might sometimes be ordered in cases of severe colic. Diaper Rash Diaper rash is a common condition in infants. Do not become alarmed if your infant has a mild rash. You can initially treat it with over-the-counter products for rashes that are present for 3 days. If there is not any improvement after 3 days of treatment, discuss other treatment options with your caregiver. Causes  Too much moisture.  Urine and stool are left touching the skin for a long time.  Infection.  Allergy to the diaper.  Diarrhea.  Babies begin eating solid food.  Antibiotic use or nursing mothers taking antibiotics. What Can Be Done About Diaper Rash?  Change your baby's diapers more often.  Wash your baby's buttocks with warm water and mild soap after each bowel movement. Dry the skin well and be sure all of the soap is removed. Wipe your baby with water and a clean cloth after each urination.  Expose your baby's buttocks to air for 10 minutes many times a day or leave the diaper off during your baby's nap.  Avoid the use of rubber or plastic pants. These trap in moisture and can cause irritation. Sometimes the elastic band at the top of the pants causes irritation and a rash.  Consider changing the type of diaper you use.  Sometimes a baby's skin will react to various types of commercial baby wipes. Avoid using wipes that can dry the skin. Consider using a clean cloth with water or a paper towel for a time to see if this helps clear up the rash.  If your baby's skin is irritated, use a barrier paste-like zinc oxide or petroleum jelly on your baby's buttocks after washing it. Other ointments may also be used. However,  do not use creams with steroids without your caregiver's permission.  Use 2  regular diapers or extra-absorbent disposable diapers at night and nap times. If you have tried all of these suggestions and have not been successful or if your baby's rash is severe ,with sores, pus, fever, or bleeding, see your caregiver. Your baby could have an infection causing the rash. The rash should clear up with proper medication. Constipation Causes  The most common constipation in infants is functional constipation. This means there is no medical problem. In babies not yet eating solid foods, it is most often caused by a lack of fluid.  Older infants on solid foods can get constipated due to:  A lack of fluid.  A lack of bulk (fiber).  Some babies have brief constipation when switching from breast milk to formula or from formula to cow's milk.  Constipation can be a side effect of medicine, but this is uncommon in infants.  Constipation that starts at or right after birth can sometimes be a sign of problems, such as problems with the intestine or the anus. Possible Solution:  Use pediatric glycerine suppositories as directed by your caregiver. You insert these gently into your infant's rectum, and often they will cause your baby to expel stool with the suppository shortly thereafter. Do not become alarmed if your baby's stooling pattern changes as long, as he or she seems to be content. None of these remedies need to be done unless your child has gone 4 or 5 days without having a bowel movement and seems to be experiencing some abdominal pain as a result of this. Normal stool for bottle-fed and breastfed babies often will be:  Mustard color or sometimes green.  A stain on diapers to loose, unformed, pea soup consistency.  Yellowish green to brownish in color.  Mild in odor or not unpleasant. Green and watery stools are not always a concern in an otherwise healthy baby. SEEK IMMEDIATE MEDICAL CARE IF:  Your baby has the following symptoms and is younger than 86 months  old.  If diarrhea and vomiting are accompanied by other signs of infection. These signs include:  Pulling ears.  Sore throat.  Crying when wetting.  Your baby is 21 months old or younger, with a rectal temperature of 100.4 F (38 C) or higher.  Your baby is older than 3 months, with a rectal temperature of 102 F (38.9 C) or higher.  Blood or pus in the stool.  Vomit is forceful or projectile.  Your baby develops signs of dehydration with fever:  Sunken eyes.  Dry mouth.  Weight loss.  Irritability.  Drowsiness (lethargic).  Decrease in urination. If your baby does not urinate in an 8-hour to 12-hour period, contact your physician.  Your baby has diaper rash that will not clear up after 3 days of treatment or has sores, pus, or bleeding.  Your baby will not take fluids as recommended, if the vomiting or diarrhea is persistent, or if the baby seems ill.  Your baby has not had a bowel movement in 4 to 5 days.  You need help with your baby because you cannot control your baby's crying because of colic. Document Released: 10/24/2000 Document Revised: 01/19/2012 Document Reviewed: 08/21/2009 [facility] Patient Information 2013 [place], [place].

## 2013-03-03 NOTE — Discharge Summary (Signed)
Obstetric Discharge Summary Reason for Admission: induction of labor Prenatal Procedures: ultrasound Intrapartum Procedures: spontaneous vaginal delivery Postpartum Procedures: none Complications-Operative and Postpartum: none Hemoglobin  Date Value Range Status  03/01/2013 11.9* 12.0 - 15.0 g/dL Final     HCT  Date Value Range Status  03/01/2013 33.7* 36.0 - 46.0 % Final    Physical Exam:  General: alert and no distress Lochia: appropriate Uterine Fundus: firm Incision: healing well DVT Evaluation: No evidence of DVT seen on physical exam.  Discharge Diagnoses: Term Pregnancy-delivered  Discharge Information: Date: 03/03/2013 Activity: pelvic rest Diet: routine Medications: PNV, Ibuprofen, Colace and Percocet Condition: stable Instructions: refer to practice specific booklet Discharge to: home Follow-up Information   Follow up with Daylan Juhnke A, MD. Schedule an appointment as soon as possible for a visit in 6 weeks.   Contact information:   [address] [place] [address] [phone]       Newborn Data: Live born female  Birth Weight: 7 lb 6.9 oz (3371 g) APGAR: 8, 9  Home with mother.  Nyellie Yetter A 03/03/2013, 2:59 PM

## 2013-04-11 ENCOUNTER — Encounter: Payer: Self-pay | Admitting: Obstetrics

## 2013-04-11 ENCOUNTER — Ambulatory Visit (INDEPENDENT_AMBULATORY_CARE_PROVIDER_SITE_OTHER): Payer: Medicaid Other | Admitting: Obstetrics

## 2013-04-11 DIAGNOSIS — Z3202 Encounter for pregnancy test, result negative: Secondary | ICD-10-CM

## 2013-04-11 DIAGNOSIS — Z309 Encounter for contraceptive management, unspecified: Secondary | ICD-10-CM

## 2013-04-11 DIAGNOSIS — J019 Acute sinusitis, unspecified: Secondary | ICD-10-CM

## 2013-04-11 LAB — POCT URINALYSIS DIPSTICK
Bilirubin, UA: NEGATIVE
Glucose, UA: NEGATIVE
Leukocytes, UA: NEGATIVE
Nitrite, UA: NEGATIVE
pH, UA: 5

## 2013-04-11 LAB — POCT URINE PREGNANCY: Preg Test, Ur: NEGATIVE

## 2013-04-11 MED ORDER — AZITHROMYCIN 250 MG PO TABS: ORAL_TABLET | ORAL | Status: DC

## 2013-04-11 MED ORDER — NORETHINDRONE 0.35 MG PO TABS: 1.0000 | ORAL_TABLET | Freq: Every day | ORAL | Status: DC

## 2013-04-11 NOTE — Progress Notes (Signed)
Subjective:     Sheri Francis is a 32 y.o. female who presents for a postpartum visit. She is 6 weeks postpartum following a spontaneous vaginal delivery. I have fully reviewed the prenatal and intrapartum course. The delivery was at 40.2 gestational weeks. Outcome: spontaneous vaginal delivery. Anesthesia: epidural. Postpartum course has been normal. Baby's course has been normal. Baby is feeding by both breast and bottle - Octavia Heir. Bleeding no bleeding. Bowel function is normal. Bladder function is normal. Patient is not sexually active. Contraception method is abstinence. Postpartum depression screening: negative.  The following portions of the patient's history were reviewed and updated as appropriate: allergies, current medications, past family history, past medical history, past social history, past surgical history and problem list.  Review of Systems Pertinent items are noted in HPI.   Objective:    BP 118/79  Pulse 88  Temp(Src) 97.2 F (36.2 C) (Oral)  Wt 171 lb (77.565 kg)  BMI 28.46 kg/m2  General:  alert and no distress   Breasts:  inspection negative, no nipple discharge or bleeding, no masses or nodularity palpable  Lungs: not examined  Heart:  not examined  Abdomen: normal findings: soft, non-tender   Vulva:  normal  Vagina: normal vagina  Cervix:  no lesions  Corpus: normal size, contour, position, consistency, mobility, non-tender  Adnexa:  no mass, fullness, tenderness  Rectal Exam: Not performed.        Assessment:     Normal postpartum exam. Pap smear not done at today's visit.   Desires contraception. Plan:    1. Contraception: abstinence 2. Wants Micronor 3. Follow up in: several months or as needed.  Annual exam next visit. 4. Micronor Rx.

## 2013-06-14 ENCOUNTER — Encounter: Payer: Self-pay | Admitting: Obstetrics

## 2014-04-11 ENCOUNTER — Telehealth: Payer: Self-pay | Admitting: *Deleted

## 2014-04-11 DIAGNOSIS — N946 Dysmenorrhea, unspecified: Secondary | ICD-10-CM

## 2014-04-11 MED ORDER — IBUPROFEN 600 MG PO TABS: 600.0000 mg | ORAL_TABLET | Freq: Four times a day (QID) | ORAL | Status: DC | PRN

## 2014-04-11 NOTE — Telephone Encounter (Signed)
Patient is also requesting a refill on her Ibuprofen 600mg 

## 2014-04-11 NOTE — Telephone Encounter (Signed)
Refills for Ibuprofen and Hydrocortisone lotion called in to pharmacy.

## 2014-04-11 NOTE — Telephone Encounter (Signed)
Pharmacy contacted the office on behalf of the patient requesting a refill on the Hydrocortisone 2.5% Lotion Sig: to apply sparingly to affected area twice daily

## 2014-06-14 ENCOUNTER — Telehealth: Payer: Self-pay | Admitting: *Deleted

## 2014-06-14 NOTE — Telephone Encounter (Signed)
Patient requesting a refill on her Tri Sprintec. Patient is due an annual exam.   Attempted to call patient and left message for patient to call the office.

## 2014-06-15 NOTE — Telephone Encounter (Signed)
Patient returned call on 06-15-14 @ 12:00p. Attempted to contact patient 06-15-14 @ 6:14. Left message on voicemail for patient to contact the office.

## 2014-06-26 NOTE — Telephone Encounter (Signed)
No response.  Call to be closed. 

## 2014-09-11 ENCOUNTER — Encounter: Payer: Self-pay | Admitting: Obstetrics

## 2016-07-17 ENCOUNTER — Ambulatory Visit (INDEPENDENT_AMBULATORY_CARE_PROVIDER_SITE_OTHER): Payer: BLUE CROSS/BLUE SHIELD | Admitting: Urgent Care

## 2016-07-17 VITALS — BP 118/82 | HR 82 | Temp 98.2°F | Resp 17 | Ht 65.0 in | Wt 165.0 lb

## 2016-07-17 DIAGNOSIS — Z021 Encounter for pre-employment examination: Secondary | ICD-10-CM | POA: Diagnosis not present

## 2016-07-17 DIAGNOSIS — Z0000 Encounter for general adult medical examination without abnormal findings: Secondary | ICD-10-CM | POA: Diagnosis not present

## 2016-07-17 NOTE — Progress Notes (Signed)
MRN: [id number]  Subjective:   Sheri Francis is a 35 y.o. female presenting for annual physical exam and urine drug screen.  Medical care team includes: PCP: No primary care provider on file. OB/GYN: Last pap smear was 2016, normal. Specialists: None.   Patient is currently single, planning [place]. They are requiring a 10-panel urine drug screen. Patient does not have a form from her school for me to complete. Denies smoking cigarettes or drinking alcohol.    Sheri Francis does not have any active problems on his problem list.   Sheri Francis has a current medication list which includes the following prescription(s): norgestimate-ethinyl estradiol triphasic. She has No Known Allergies.  Sheri Francis Denies past medical history. Denies past surgical history.   Denies family history of cancer, diabetes, HTN, HL, heart disease, stroke, mental illness.   Immunizations: Up to date per patient through her work.  Review of Systems  Constitutional: Negative for chills, diaphoresis, fever, malaise/fatigue and weight loss.  HENT: Negative for congestion, ear discharge, ear pain, hearing loss, nosebleeds, sore throat and tinnitus.   Eyes: Negative for blurred vision, double vision, photophobia, pain, discharge and redness.  Respiratory: Negative for cough, shortness of breath and wheezing.   Cardiovascular: Negative for chest pain, palpitations and leg swelling.  Gastrointestinal: Negative for abdominal pain, blood in stool, constipation, diarrhea, nausea and vomiting.  Genitourinary: Negative for dysuria, flank pain, frequency, hematuria and urgency.  Musculoskeletal: Negative for back pain, joint pain and myalgias.  Skin: Negative for itching and rash.  Neurological: Negative for dizziness, tingling, seizures, loss of consciousness, weakness and headaches.  Endo/Heme/Allergies: Negative for polydipsia.  Psychiatric/Behavioral: Negative for depression,  hallucinations, memory loss, substance abuse and suicidal ideas. The patient is not nervous/anxious and does not have insomnia.     Objective:   Vitals: BP 118/82 (BP Location: Right Arm, Patient Position: Sitting, Cuff Size: Large)   Pulse 82   Temp 98.2 F (36.8 C) (Oral)   Resp 17   Ht 5\' 5"  (1.651 m)   Wt 165 lb (74.8 kg)   LMP 07/03/2016 (Approximate)   SpO2 100%   BMI 27.46 kg/m   Physical Exam  Constitutional: She is oriented to person, place, and time. She appears well-developed and well-nourished.  HENT:  TM's intact bilaterally, no effusions or erythema. Nasal turbinates pink and moist, nasal passages patent. No sinus tenderness. Oropharynx clear, mucous membranes moist, dentition in good repair.  Eyes: Conjunctivae and EOM are normal. Pupils are equal, round, and reactive to light. Right eye exhibits no discharge. Left eye exhibits no discharge. No scleral icterus.  Neck: Normal range of motion. Neck supple. No thyromegaly present.  Cardiovascular: Normal rate, regular rhythm and intact distal pulses.  Exam reveals no gallop and no friction rub.   No murmur heard. Pulmonary/Chest: No respiratory distress. She has no wheezes. She has no rales.  Abdominal: Soft. Bowel sounds are normal. She exhibits no distension and no mass. There is no tenderness.  Musculoskeletal: Normal range of motion. She exhibits no edema or tenderness.  Lymphadenopathy:    She has no cervical adenopathy.  Neurological: She is alert and oriented to person, place, and time. She has normal reflexes.  Skin: Skin is warm and dry. No rash noted. No erythema. No pallor.  Psychiatric: She has a normal mood and affect.   Assessment and Plan :   1. Annual physical exam - Discussed healthy lifestyle, diet, exercise, preventative care, vaccinations, and addressed patient's concerns.  -  Letter provided for patient confirming her physical exam was completed. Patient refused any lab work for her annual  physical.  2. Drug screening, pre-employment - Urine drug screen 8 is pending.   Wallis Bamberg, PA-C [facility] [phone] 07/17/2016  3:30 PM

## 2016-07-17 NOTE — Patient Instructions (Addendum)

## 2016-07-18 LAB — PAIN MGMT, PROF 8 CONF W/O MM, U
6 ACETYLMORPHINE: NEGATIVE ng/mL (ref <10)
ALCOHOL METABOLITES: NEGATIVE ng/mL (ref <500)
Amphetamines: NEGATIVE ng/mL (ref <500)
BUPRENORPHINE: NEGATIVE ng/mL (ref <5)
Benzodiazepines: NEGATIVE ng/mL (ref <100)
Cocaine Metabolite: NEGATIVE ng/mL (ref <150)
Creatinine: 64.3 mg/dL (ref >=20.0)
MDMA: NEGATIVE ng/mL (ref <500)
Marijuana Metabolite: NEGATIVE ng/mL (ref <20)
OPIATES: NEGATIVE ng/mL (ref <100)
OXYCODONE: NEGATIVE ng/mL (ref <100)
Oxidant: NEGATIVE ug/mL (ref <200)
PLEASE NOTE: 0
pH: 6.37 (ref 4.5–9.0)

## 2016-07-22 ENCOUNTER — Telehealth: Payer: Self-pay | Admitting: *Deleted

## 2016-07-22 ENCOUNTER — Encounter: Payer: Self-pay | Admitting: Urgent Care

## 2016-07-22 NOTE — Telephone Encounter (Signed)
Laqueta DueKaya came me about patient.  She stated patient was here because she got lab results from a urine drug that she said was not suppose to be done.  After talking with patient she stated that [facility] had [facility] and Ebony HailDarren that she did not want test done since it was going to get a [facility].  She also stated that she told front desk when she checked out, Almira CoasterGina, that she would like the drug screen test canceled because she would be billed from [facility].  She stated that she will fight this if this cannot be taken care of.  Sidney Acedivised Nikki of the situation and she will talk with Vernona RiegerLaura about this.  Advised patient that someone from management will be giving her a call.

## 2016-08-04 ENCOUNTER — Ambulatory Visit: Payer: Self-pay | Admitting: Obstetrics

## 2016-08-14 ENCOUNTER — Ambulatory Visit (INDEPENDENT_AMBULATORY_CARE_PROVIDER_SITE_OTHER): Payer: BLUE CROSS/BLUE SHIELD | Admitting: Obstetrics

## 2016-08-14 ENCOUNTER — Encounter: Payer: Self-pay | Admitting: Obstetrics

## 2016-08-14 VITALS — BP 104/68 | HR 74 | Temp 98.9°F | Wt 165.2 lb

## 2016-08-14 DIAGNOSIS — B373 Candidiasis of vulva and vagina: Secondary | ICD-10-CM | POA: Diagnosis not present

## 2016-08-14 DIAGNOSIS — N760 Acute vaginitis: Secondary | ICD-10-CM

## 2016-08-14 DIAGNOSIS — Z01419 Encounter for gynecological examination (general) (routine) without abnormal findings: Secondary | ICD-10-CM

## 2016-08-14 DIAGNOSIS — Z1151 Encounter for screening for human papillomavirus (HPV): Secondary | ICD-10-CM | POA: Diagnosis not present

## 2016-08-14 DIAGNOSIS — Z113 Encounter for screening for infections with a predominantly sexual mode of transmission: Secondary | ICD-10-CM

## 2016-08-14 DIAGNOSIS — Z3041 Encounter for surveillance of contraceptive pills: Secondary | ICD-10-CM | POA: Diagnosis not present

## 2016-08-14 DIAGNOSIS — N946 Dysmenorrhea, unspecified: Secondary | ICD-10-CM

## 2016-08-14 DIAGNOSIS — Z124 Encounter for screening for malignant neoplasm of cervix: Secondary | ICD-10-CM | POA: Diagnosis not present

## 2016-08-14 DIAGNOSIS — R309 Painful micturition, unspecified: Secondary | ICD-10-CM

## 2016-08-14 DIAGNOSIS — R300 Dysuria: Secondary | ICD-10-CM

## 2016-08-14 LAB — POCT URINALYSIS DIPSTICK
BILIRUBIN UA: NEGATIVE
GLUCOSE UA: NEGATIVE
Ketones, UA: NEGATIVE
NITRITE UA: NEGATIVE
Protein, UA: NEGATIVE
Spec Grav, UA: <=1.005
Urobilinogen, UA: 0.2
pH, UA: 7

## 2016-08-14 MED ORDER — IBUPROFEN 800 MG PO TABS: 800.0000 mg | ORAL_TABLET | Freq: Three times a day (TID) | ORAL | 5 refills | Status: AC | PRN

## 2016-08-14 MED ORDER — NITROFURANTOIN MONOHYD MACRO 100 MG PO CAPS
100.0000 mg | ORAL_CAPSULE | Freq: Two times a day (BID) | ORAL | 2 refills | Status: AC
Start: 2016-08-14 — End: ?

## 2016-08-14 MED ORDER — NORGESTIM-ETH ESTRAD TRIPHASIC 0.18/0.215/0.25 MG-35 MCG PO TABS: 1.0000 | ORAL_TABLET | Freq: Every day | ORAL | 11 refills | Status: AC

## 2016-08-14 NOTE — Progress Notes (Signed)
Patient is in the office for annual exam and states that she may want to change her birth control pills but is unsure about what she wants to change to.

## 2016-08-14 NOTE — Progress Notes (Signed)
Subjective:        Sheri Francis is a 35 y.o. female here for a routine exam.  Current complaints: none.    Personal health questionnaire:  Is patient [name], have a family history of breast and/or ovarian cancer: no Is there a family history of uterine cancer diagnosed at age < 37, gastrointestinal cancer, urinary tract cancer, family member who is a Personnel officer syndrome-associated carrier: no Is the patient overweight and hypertensive, family history of diabetes, personal history of gestational diabetes, preeclampsia or PCOS: no Is patient over 30, have PCOS,  family history of premature CHD under age 44, diabetes, smoke, have hypertension or peripheral artery disease:  no At any time, has a partner hit, kicked or otherwise hurt or frightened you?: no Over the past 2 weeks, have you felt down, depressed or hopeless?: no Over the past 2 weeks, have you felt little interest or pleasure in doing things?:no   Gynecologic History Patient's last menstrual period was 07/31/2016 (approximate). Contraception: OCP (estrogen/progesterone) Last Pap: 2016. Results were: normal Last mammogram: n/a. Results were: n/a  Obstetric History OB History  Gravida Para Term Preterm AB Living  2 2 2     2   SAB TAB Ectopic Multiple Live Births          2    # Outcome Date GA Lbr Len/2nd Weight Sex Delivery Anes PTL Lv  2 Term 02/28/13 [redacted]w[redacted]d  7 lb 6.9 oz (3.371 kg) M Vag-Spont EPI  LIV     Birth Comments: within normal limits  1 Term 10/26/07 [redacted]w[redacted]d  5 lb 13 oz (2.637 kg) F Vag-Spont EPI N LIV      History reviewed. No pertinent past medical history.  Past Surgical History:  Procedure Laterality Date  . NO PAST SURGERIES       Current Outpatient Prescriptions:  .  Norgestimate-Ethinyl Estradiol Triphasic (TRI-SPRINTEC) 0.18/0.215/0.25 MG-35 MCG tablet, Take 1 tablet by mouth daily., Disp: 1 Package, Rfl: 11 .  ibuprofen (ADVIL,MOTRIN) 800 MG tablet, Take 1 tablet (800 mg total) by mouth  every 8 (eight) hours as needed., Disp: 30 tablet, Rfl: 5 .  nitrofurantoin, macrocrystal-monohydrate, (MACROBID) 100 MG capsule, Take 1 capsule (100 mg total) by mouth 2 (two) times daily., Disp: 14 capsule, Rfl: 2 No Known Allergies  Social History  Substance Use Topics  . Smoking status: Never Smoker  . Smokeless tobacco: Never Used  . Alcohol use No    History reviewed. No pertinent family history.    Review of Systems  Constitutional: negative for fatigue and weight loss Respiratory: negative for cough and wheezing Cardiovascular: negative for chest pain, fatigue and palpitations Gastrointestinal: negative for abdominal pain and change in bowel habits Musculoskeletal:negative for myalgias Neurological: negative for gait problems and tremors Behavioral/Psych: negative for abusive relationship, depression Endocrine: negative for temperature intolerance   Genitourinary:negative for abnormal menstrual periods, genital lesions, hot flashes, sexual problems and vaginal discharge Integument/breast: negative for breast lump, breast tenderness, nipple discharge and skin lesion(s)    Objective:       BP 104/68   Pulse 74   Temp 98.9 F (37.2 C) (Oral)   Wt 165 lb 3.2 oz (74.9 kg)   LMP 07/31/2016 (Approximate)   BMI 27.49 kg/m  General:   alert  Skin:   no rash or abnormalities  Lungs:   clear to auscultation bilaterally  Heart:   regular rate and rhythm, S1, S2 normal, no murmur, click, rub or gallop  Breasts:   normal without  suspicious masses, skin or nipple changes or axillary nodes  Abdomen:  normal findings: no organomegaly, soft, non-tender and no hernia  Pelvis:  External genitalia: normal general appearance Urinary system: urethral meatus normal and bladder without fullness, nontender Vaginal: normal without tenderness, induration or masses Cervix: normal appearance Adnexa: normal bimanual exam Uterus: anteverted and non-tender, normal size   Lab Review Urine  pregnancy test Labs reviewed yes Radiologic studies reviewed no  50% of 20 min visit spent on counseling and coordination of care.   Assessment:    Healthy female exam.    Plan:    Education reviewed: calcium supplements, low fat, low cholesterol diet, safe sex/STD prevention, self breast exams and weight bearing exercise. Contraception: OCP (estrogen/progesterone). Follow up in: 1 year.   Meds ordered this encounter  Medications  . Norgestimate-Ethinyl Estradiol Triphasic (TRI-SPRINTEC) 0.18/0.215/0.25 MG-35 MCG tablet    Sig: Take 1 tablet by mouth daily.    Dispense:  1 Package    Refill:  11  . ibuprofen (ADVIL,MOTRIN) 800 MG tablet    Sig: Take 1 tablet (800 mg total) by mouth every 8 (eight) hours as needed.    Dispense:  30 tablet    Refill:  5  . nitrofurantoin, macrocrystal-monohydrate, (MACROBID) 100 MG capsule    Sig: Take 1 capsule (100 mg total) by mouth 2 (two) times daily.    Dispense:  14 capsule    Refill:  2   Orders Placed This Encounter  Procedures  . Urine Culture  . POCT Urinalysis Dipstick     Patient ID: Sheri Francis, female   DOB: 1981-10-27, 35 y.o.   MRN: [id number]

## 2016-08-15 LAB — CERVICOVAGINAL ANCILLARY ONLY
CHLAMYDIA, DNA PROBE: NEGATIVE
Neisseria Gonorrhea: NEGATIVE

## 2016-08-15 LAB — CYTOLOGY - PAP

## 2016-08-16 LAB — URINE CULTURE

## 2016-08-18 ENCOUNTER — Other Ambulatory Visit: Payer: Self-pay | Admitting: Obstetrics

## 2016-08-18 DIAGNOSIS — N760 Acute vaginitis: Principal | ICD-10-CM

## 2016-08-18 DIAGNOSIS — B373 Candidiasis of vulva and vagina: Secondary | ICD-10-CM

## 2016-08-18 DIAGNOSIS — B3731 Acute candidiasis of vulva and vagina: Secondary | ICD-10-CM

## 2016-08-18 DIAGNOSIS — B9689 Other specified bacterial agents as the cause of diseases classified elsewhere: Secondary | ICD-10-CM

## 2016-08-18 LAB — CERVICOVAGINAL ANCILLARY ONLY
Bacterial vaginitis: POSITIVE — AB
CANDIDA VAGINITIS: POSITIVE — AB

## 2016-08-18 MED ORDER — TERCONAZOLE 0.8 % VA CREA: 1.0000 | TOPICAL_CREAM | Freq: Every day | VAGINAL | 2 refills | Status: AC

## 2016-08-18 MED ORDER — METRONIDAZOLE 500 MG PO TABS: 500.0000 mg | ORAL_TABLET | Freq: Two times a day (BID) | ORAL | 2 refills | Status: AC

## 2016-08-29 ENCOUNTER — Telehealth: Payer: Self-pay

## 2017-01-02 ENCOUNTER — Ambulatory Visit: Payer: BLUE CROSS/BLUE SHIELD

## 2017-01-03 ENCOUNTER — Ambulatory Visit (INDEPENDENT_AMBULATORY_CARE_PROVIDER_SITE_OTHER): Payer: BLUE CROSS/BLUE SHIELD | Admitting: Family Medicine

## 2017-01-03 ENCOUNTER — Encounter: Payer: Self-pay | Admitting: Family Medicine

## 2017-01-03 VITALS — BP 114/62 | HR 88 | Temp 98.7°F | Resp 16 | Ht 65.0 in | Wt 166.0 lb

## 2017-01-03 DIAGNOSIS — N3091 Cystitis, unspecified with hematuria: Secondary | ICD-10-CM | POA: Diagnosis not present

## 2017-01-03 DIAGNOSIS — R350 Frequency of micturition: Secondary | ICD-10-CM

## 2017-01-03 LAB — POCT URINALYSIS DIP (MANUAL ENTRY)
Bilirubin, UA: NEGATIVE
Glucose, UA: NEGATIVE
Ketones, POC UA: NEGATIVE
Leukocytes, UA: NEGATIVE
Nitrite, UA: NEGATIVE
PH UA: 6
PROTEIN UA: NEGATIVE
Spec Grav, UA: 1.01
UROBILINOGEN UA: 0.2

## 2017-01-03 LAB — POC MICROSCOPIC URINALYSIS (UMFC): Mucus: ABSENT

## 2017-01-03 MED ORDER — CEPHALEXIN 500 MG PO CAPS: 500.0000 mg | ORAL_CAPSULE | Freq: Three times a day (TID) | ORAL | 0 refills | Status: AC

## 2017-01-03 NOTE — Progress Notes (Signed)
Sheri Francis is a 36 y.o. female who [facility] with complaints concerning for UTI:  1.  Concern for UTI: Sheri Francis is a 36 y.o. female who complains of urinary frequency, urgency and dysuria x 7 days.  She had a bottle of Macrobid and flagyl at home as "PRN prescription" from another physician.  Finished 3 days worth of macrobid about 3 days ago.  No dysuria/urgency since then.  Does have dark colored urine and frequeyncy though.  Only took a few days of Flagyl.  Hasn't had any discharge.    She denies any flank or back pain, fever, chills, vomiting, or abnormal vaginal discharge/itching/bleeding.  Does endorse some suprapubic pressure.  No history of incontinence.    The following portions of the patient's history were reviewed and updated as appropriate: allergies, current medications, past medical history, family and social history, and problem list.  Patient is a nonsmoker.    No past medical history on file. Past Surgical History:  Procedure Laterality Date  . NO PAST SURGERIES      Medications reviewed. Current Outpatient Prescriptions  Medication Sig Dispense Refill  . ibuprofen (ADVIL,MOTRIN) 800 MG tablet Take 1 tablet (800 mg total) by mouth every 8 (eight) hours as needed. 30 tablet 5  . Norgestimate-Ethinyl Estradiol Triphasic (TRI-SPRINTEC) 0.18/0.215/0.25 MG-35 MCG tablet Take 1 tablet by mouth daily. 1 Package 11  . metroNIDAZOLE (FLAGYL) 500 MG tablet Take 1 tablet (500 mg total) by mouth 2 (two) times daily. (Patient not taking: Reported on 01/03/2017) 14 tablet 2  . nitrofurantoin, macrocrystal-monohydrate, (MACROBID) 100 MG capsule Take 1 capsule (100 mg total) by mouth 2 (two) times daily. (Patient not taking: Reported on 01/03/2017) 14 capsule 2  . terconazole (TERAZOL 3) 0.8 % vaginal cream Place 1 applicator vaginally at bedtime. (Patient not taking: Reported on 01/03/2017) 20 g 2   No current facility-administered medications for this visit.      ROS as above otherwise neg.       Objective:   Physical Exam BP 114/62   Pulse 88   Temp 98.7 F (37.1 C)   Resp 16   Ht 5\' 5"  (1.651 m)   Wt 166 lb (75.3 kg)   SpO2 99%   BMI 27.62 kg/m  Gen:  Alert, cooperative patient who appears stated age in no acute distress.  Vital signs reviewed. Mouth: MMM Cardiac:  Regular rate and rhythm without murmur auscultated.  Good S1/S2. Pulm:  Clear to auscultation bilaterally with good air movement.  No wheezes or rales noted.   Abdomen:  Soft/ND/NT.  No CVA tenderness bilaterally   Results for orders placed or performed in visit on 01/03/17  POCT urinalysis dipstick  Result Value Ref Range   Color, UA yellow yellow   Clarity, UA clear clear   Glucose, UA negative negative   Bilirubin, UA negative negative   Ketones, POC UA negative negative   Spec Grav, UA 1.010    Blood, UA moderate (A) negative   pH, UA 6.0    Protein Ur, POC negative negative   Urobilinogen, UA 0.2    Nitrite, UA Negative Negative   Leukocytes, UA Negative Negative    Imp/Plan: 1.  UTI w/ hematuria: - Likely incompletely treated UTI with home Macrobid.  Sending for culture - Treatment with keflex x 7 days. - FU in 2 weeks for repeat UA to ensure hematuria has resolved.  Treat based on symptoms and U/A. Pyridium prn. Call or return to clinic prn  if these symptoms worsen or fail to improve as anticipated, or if she begins to have any back pain, fevers, or chills.

## 2017-01-03 NOTE — Patient Instructions (Addendum)
It does indeed look like you have a urinary tract infection.  Take the Keflex twice daily for the next 7 days.  Come back for a repeat urine test in about 2 weeks to make sure the blood has completely cleared in your urine.    We will send this for culture to make sure the antibiotics are the right ones.    If you start having any worsening pain, vomiting, unable to hold down fluids, fevers, or other concerns, do not hesitate to come back or go to the ED after hours.    It was good to see you today!    IF you received an x-ray today, you will receive an invoice from [facility]. Please contact [facility] at [phone] with questions or concerns regarding your invoice.   IF you received labwork today, you will receive an invoice from [facility]. Please contact [facility] at [phone] with questions or concerns regarding your invoice.   Our billing staff will not be able to assist you with questions regarding bills from these companies.  You will be contacted with the lab results as soon as they are available. The fastest way to get your results is to activate your My Chart account. Instructions are located on the last page of this paperwork. If you have not heard from us regarding the results in 2 weeks, please contact this office.

## 2017-01-05 LAB — URINE CULTURE: ORGANISM ID, BACTERIA: NO GROWTH

## 2017-01-12 ENCOUNTER — Ambulatory Visit: Payer: BLUE CROSS/BLUE SHIELD | Admitting: Obstetrics

## 2017-11-30 ENCOUNTER — Telehealth: Payer: Self-pay | Admitting: *Deleted

## 2017-11-30 NOTE — Telephone Encounter (Signed)
Left vmail for patient to call back and schedule Annual/pap..Marland Kitchen

## 2018-01-21 ENCOUNTER — Encounter: Payer: Self-pay | Admitting: *Deleted

## 2018-04-22 NOTE — Telephone Encounter (Signed)
See previous notes.

## 2022-12-04 ENCOUNTER — Encounter: Payer: Self-pay | Admitting: Neurology

## 2022-12-04 ENCOUNTER — Ambulatory Visit (INDEPENDENT_AMBULATORY_CARE_PROVIDER_SITE_OTHER): Payer: Medicare Other | Admitting: Neurology

## 2022-12-04 VITALS — BP 115/76 | HR 75

## 2022-12-04 DIAGNOSIS — Z8679 Personal history of other diseases of the circulatory system: Secondary | ICD-10-CM

## 2022-12-04 DIAGNOSIS — G8110 Spastic hemiplegia affecting unspecified side: Secondary | ICD-10-CM

## 2022-12-04 MED ORDER — GABAPENTIN 100 MG PO CAPS: 300.0000 mg | ORAL_CAPSULE | Freq: Two times a day (BID) | ORAL | 3 refills | Status: DC

## 2022-12-04 MED ORDER — ATORVASTATIN CALCIUM 40 MG PO TABS: 40.0000 mg | ORAL_TABLET | Freq: Every day | ORAL | 3 refills | Status: DC

## 2022-12-04 MED ORDER — ASPIRIN 81 MG PO TBEC: 81.0000 mg | DELAYED_RELEASE_TABLET | Freq: Every day | ORAL | 12 refills | Status: AC

## 2022-12-04 NOTE — Progress Notes (Signed)
[facility] [address] [place].  [address] [phone]       OFFICE CONSULT NOTE  Ms. Sheri Francis Date of Birth:  1981/04/26 Medical Record Number:  [id number]   Referring MD:  Knox Royalty  Reason for Referral:  Stroke f/u  HPI: Sheri Francis is a 42 year old African-American lady seen today for initial consultation to establish stroke follow-up.  History is obtained from the patient and ruffles notes and review of records in care everywhere.  Patient has no significant past medical history prior to her stroke on 04/04/2020.  She is on [place] with was working in [place] at that time.  She developed sudden onset of left hemiplegia and gaze deviation following violent coughing and sneezing to the right carotid artery dissection.  I do not have details of the hospital mission family as per family physician's office note successful mechanical thrombectomy of the right ICA/MCA by Dr. Noralyn Pick [facility] but required right frontotemporal and parietal decompressive craniectomy with cerebral edema and brain herniation.  Cranioplasty with Dr. Lanae Boast 3 months later.  He had a prolonged hospital course with significant hemiplegia and had a PEG tube and [facility] neurolog[facility] Dr[facility]yanna Massaqu[facility] at [facility]..  She was placed on aspirin 325 mg daily and discharged from the hospital on 04/27/2020.  He subsequently had outpatient neuropsychology testing done on 07/11/2020 by Dr. 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  I do not have the actual report falling and diet was advanced and PEG tube was removed.  She has finished multiple courses of physical ,occupational and speech therapy . She currently is able to ambulate using a cane with her left foot AFO or wheelchair for long distances.  She is living alone with her 3 young kids age 64, 51 and 48.  Her family lives  nearby and provide considerable support and help.  He is currently on disability due to work.  Gotten Botox in the left arm on a few occasions but did not find substantial benefit.  She is currently on aspirin 325 mg daily and gabapentin 100 mg twice daily to help with neuropathy pain.  She has not had any recurrent stroke or TIA symptoms   ROS:   14 system review of systems is positive for weakness, pain, stiffness, difficulty walking, numbness and all other systems negative  PMH: No past medical history on file.  Social History:  Social History   Socioeconomic History   Marital status: Single    Spouse name: Not on file   Number of children: 2   Years of education: Not on file   Highest education level: Not on file  Occupational History   Not on file  Tobacco Use   Smoking status: Never   Smokeless tobacco: Never  Substance and Sexual Activity   Alcohol use: No   Drug use: No   Sexual activity: Not Currently    Partners: Male  Other Topics Concern   Not on file  Social History Narrative   Not on file   Social Determinants of Health   Financial Resource Strain: Not on file  Food Insecurity: Not on file  Transportation Needs: Not on file  Physical Activity: Not on file  Stress: Not on file  Social Connections: Not on file  Intimate Partner Violence: Not on file    Medications:   Current Outpatient Medications on File Prior to Visit  Medication Sig Dispense  Refill   cephALEXin (KEFLEX) 500 MG capsule Take 1 capsule (500 mg total) by mouth 3 (three) times daily. 21 capsule 0   ibuprofen (ADVIL,MOTRIN) 800 MG tablet Take 1 tablet (800 mg total) by mouth every 8 (eight) hours as needed. 30 tablet 5   metroNIDAZOLE (FLAGYL) 500 MG tablet Take 1 tablet (500 mg total) by mouth 2 (two) times daily. (Patient not taking: Reported on 01/03/2017) 14 tablet 2   nitrofurantoin, macrocrystal-monohydrate, (MACROBID) 100 MG capsule Take 1 capsule (100 mg total) by mouth 2 (two) times  daily. (Patient not taking: Reported on 01/03/2017) 14 capsule 2   Norgestimate-Ethinyl Estradiol Triphasic (TRI-SPRINTEC) 0.18/0.215/0.25 MG-35 MCG tablet Take 1 tablet by mouth daily. 1 Package 11   terconazole (TERAZOL 3) 0.8 % vaginal cream Place 1 applicator vaginally at bedtime. (Patient not taking: Reported on 01/03/2017) 20 g 2   No current facility-administered medications on file prior to visit.    Allergies:  No Known Allergies  Physical Exam General: well developed, well nourished middle-aged African-American lady, seated, in no evident distress Head: head normocephalic and atraumatic.   Neck: supple with no carotid or supraclavicular bruits Cardiovascular: regular rate and rhythm, no murmurs Musculoskeletal: no deformity Skin:  no rash/petichiae Vascular:  Normal pulses all extremities  Neurologic Exam Mental Status: Awake and fully alert. Oriented to place and time. Recent and remote memory intact. Attention span, concentration and fund of knowledge appropriate. Mood and affect appropriate.  Diminished recall 2/3. Cranial Nerves: Fundoscopic exam reveals sharp disc margins. Pupils equal, briskly reactive to light. Extraocular movements full without nystagmus. Visual fields show dense left homonymous hemianopsia l to confrontation. Hearing intact. Facial sensation intact.  Mild left nasolabial fold weakness., tongue, palate moves normally and symmetrically.  Motor: Spastic left hemiplegia with 0/5 strength with nonfixed flexion contraction of the left wrist, fingers and elbow as well as left foot drop.  Tone is increased on the left with spasticity..  Normal strength on the right. sensory.:  diminished left hemibody touch , pinprick , position and vibratory sensation.  Coordination: Normal on the right side and cannot be tested on the left.   Gait and Station: Arises from chair with son difficulty.  Gait is hemiplegic using a cane with left foot drop with left foot brace with  slight circumduction. Reflexes: 2+ and asymmetric and brisker on the left. Toes downgoing.   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 I also advised the patient to eat a healthy diet with plenty of whole grains, cereals, fruits and vegetables, exercise regularly and maintain ideal body weight .he was advised to use a cane at all times for ambulation.  Recommend booster dose of atorvastatin to 40 mg.  Check CT angiogram of the brain and neck to follow-up on her carotid dissection.  Followup  in the future with months or call earlier if necessary.  Greater than 50% time during this prolonged 60-minute consultation visit was spent on counseling and coordination of care about her large right hemispheric stroke and carotid dissection question about patient and treatment  Antony Contras, MD Note: This document was prepared with digital dictation and possible smart phrase technology. Any transcriptional errors that result from this process are unintentional.

## 2022-12-04 NOTE — Patient Instructions (Signed)
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 I also advised the patient to eat a healthy diet with plenty of whole grains, cereals, fruits and vegetables, exercise regularly and maintain ideal body weight .he was advised to use a cane at all times for ambulation.  Recommend booster dose of atorvastatin to 40 mg.  Check CT angiogram of the brain and neck to follow-up on her carotid dissection.  Followup in the future with months or call earlier if necessary.  Carotid Artery Dissection  A carotid artery dissection is a tear in a carotid artery. The carotid arteries are blood vessels on each side of the neck. They carry blood from the heart to the brain and other parts of the head. When an artery tears, blood collects inside the layers of the artery wall. This can cause a blood clot. This condition increases the risk of a stroke if it is not diagnosed and treated right away. What are the causes? This condition may be caused by having: A neck injury due to sudden or excessive neck movement. This is called a traumatic dissection. Weak blood vessel walls. The walls may tear even when no injury occurs (spontaneous dissection). An angiogram procedure. This is a rare cause of carotid artery dissection. Chiropractic manipulation. A minimal form of blunt trauma. In many cases, the cause of this condition is not known. What increases the risk? Carotid artery dissection is more likely to develop in people who are 61-15 years old. You are more likely to have a spontaneous  carotid artery dissection if you have: High blood pressure (hypertension). Migraine disorder. Inherited diseases or connective tissue disorders that weaken the blood vessels. Fibromuscular dysplasia. Tobacco use. Recent infection. Higher levels of an amino acid called homocysteine. What are the signs or symptoms? Symptoms of carotid artery dissection are similar to signs of a stroke. Symptoms may include: Headache. Dizziness. Vertigo. This is a feeling that you or things around you are moving when they are not. Double vision. Eye, face, or neck pain. Weakness on one side of the face or body. Drooping eyelid. Loss of taste. Ringing in the ear. Loss of feeling on one side of the body. You may not be able to feel pain, heat, or cold. Loss of balance and coordination. How is this diagnosed? This condition is diagnosed with tests, such as: CT angiogram. This test uses a computer to take X-rays of your carotid arteries. A dye may be injected into your blood to show the inside of your blood vessels more clearly. MRI angiogram. This is used to check the health of the blood vessels. Cerebral angiogram. This test takes X-ray images of the blood vessels in the neck and brain. A dye is used to show the inside of the blood vessels. Doppler ultrasound. This test uses sound waves to create images of the carotid arteries. It shows how well blood flows through your arteries. How is this treated? Treatment for this condition depends on the cause of your carotid artery dissection and your overall health. The goal of treatment is to prevent a stroke. If you are having a stroke, it is important  to get treatment quickly. Treatment may include: Blood thinners. This medicine helps to prevent blood clots. This may be given first through an IV, and then as pills for 6 months. Antiplatelet medicines. This medicine keeps the platelets from sticking together, which prevents blood clots from forming. Procedures to  widen a narrow blood vessel (angioplasty) or to place a mesh tube (stent) inside the blood vessel to keep it open. Surgery to repair the area. This is rarely needed. Follow these instructions at home: Medicines Take over-the-counter and prescription medicines as told by your health care provider. If you are taking blood thinners: Talk with your health care provider before you take any medicines that contain aspirin or NSAIDs, such as ibuprofen. These medicines increase your risk for dangerous bleeding. Take your medicine exactly as told, at the same time every day. Avoid activities that could cause injury or bruising. Follow instructions about how to prevent falls. Wear a medical alert bracelet or carry a card that lists what medicines you take. Lifestyle  Work with your health care provider to control your hypertension. You may need to: Exercise regularly. Check with your health care provider before starting any new type of exercise. Eat a heart-healthy diet of fruits, vegetables, whole grains, and lean meats. Limit unhealthy fats. Eat more healthy fats such as avocados, eggs, and oily fish. Reduce the amount of salt (sodium) that you eat to less than 1,500 mg a day. Reduce stress by participating in things that you enjoy and avoiding things that cause you stress. Avoid activities that put you at risk for neck injuries, such as contact sports. Do not use any products that contain nicotine or tobacco. These products include cigarettes, chewing tobacco, and vaping devices, such as e-cigarettes. If you need help quitting, ask your health care provider. General instructions Keep all follow-up visits. This is important. Get help right away if: You have any symptoms of a stroke. "BE FAST" is an easy way to remember the main warning signs of a stroke: B - Balance. Signs are dizziness, sudden trouble walking, or loss of balance. E - Eyes. Signs are trouble seeing or a sudden change in vision. F -  Face. Signs are sudden weakness or numbness of the face, or the face or eyelid drooping on one side. A - Arms. Signs are weakness or numbness in an arm. This happens suddenly and usually on one side of the body. S - Speech. Signs are sudden trouble speaking, slurred speech, or trouble understanding what people say. T - Time. Time to call emergency services. Write down what time symptoms started. You have other signs of a stroke, such as: A sudden, severe headache with no known cause. Nausea or vomiting. Seizure. You have other symptoms, such as: Difficulty breathing. Chest pain. These symptoms may be an emergency. Get help right away. Call 911. Do not wait to see if the symptoms will go away. Do not drive yourself to the hospital. Summary A carotid artery dissection is a tear in a carotid artery. This can cause a blood clot. Treatment for this condition depends on the cause of your carotid artery dissection and your overall health. The most important goal of treatment is to prevent a stroke. The condition may be treated with blood thinners, procedures to widen a narrow blood vessel, or surgery to repair the area. Avoid activities that put you at risk for neck injuries. This information is not intended to replace advice given to you by your health care provider. Make sure  you discuss any questions you have with your health care provider. Document Revised: 09/10/2021 Document Reviewed: 09/10/2021 Elsevier Patient Education  [place].

## 2022-12-05 ENCOUNTER — Telehealth: Payer: Self-pay | Admitting: Neurology

## 2022-12-05 NOTE — Telephone Encounter (Signed)
medicare NPR sent to GI [phone] 

## 2022-12-11 DIAGNOSIS — Z419 Encounter for procedure for purposes other than remedying health state, unspecified: Secondary | ICD-10-CM | POA: Diagnosis not present

## 2022-12-24 ENCOUNTER — Other Ambulatory Visit: Payer: Self-pay

## 2022-12-24 ENCOUNTER — Telehealth: Payer: Self-pay | Admitting: Neurology

## 2022-12-24 MED ORDER — GABAPENTIN 100 MG PO CAPS: 300.0000 mg | ORAL_CAPSULE | Freq: Two times a day (BID) | ORAL | 3 refills | Status: DC

## 2022-12-24 MED ORDER — ATORVASTATIN CALCIUM 40 MG PO TABS: 40.0000 mg | ORAL_TABLET | Freq: Every day | ORAL | 3 refills | Status: AC

## 2022-12-24 NOTE — Telephone Encounter (Signed)
Prescriptions have been refilled

## 2022-12-24 NOTE — Telephone Encounter (Signed)
Pt is needing her gabapentin (NEURONTIN) 100 MG capsule and her atorvastatin (LIPITOR) 40 MG tablet sent to the [facility] on [address]

## 2022-12-25 ENCOUNTER — Ambulatory Visit
Admission: RE | Admit: 2022-12-25 | Discharge: 2022-12-25 | Disposition: A | Discharge status: Home / Self Care | Payer: Medicare Other | Source: Ambulatory Visit | Attending: Neurology | Admitting: Neurology

## 2022-12-25 DIAGNOSIS — Z85841 Personal history of malignant neoplasm of brain: Secondary | ICD-10-CM | POA: Diagnosis not present

## 2022-12-25 DIAGNOSIS — Z8669 Personal history of other diseases of the nervous system and sense organs: Secondary | ICD-10-CM | POA: Diagnosis not present

## 2022-12-25 DIAGNOSIS — Z8673 Personal history of transient ischemic attack (TIA), and cerebral infarction without residual deficits: Secondary | ICD-10-CM | POA: Diagnosis not present

## 2022-12-25 DIAGNOSIS — I6611 Occlusion and stenosis of right anterior cerebral artery: Secondary | ICD-10-CM | POA: Diagnosis not present

## 2022-12-25 MED ORDER — IOPAMIDOL (ISOVUE-370) INJECTION 76%
75.0000 mL | Freq: Once | INTRAVENOUS | Status: AC | PRN
  Administered 2022-12-25: 75 mL via INTRAVENOUS

## 2022-12-26 NOTE — Progress Notes (Signed)
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

## 2022-12-29 ENCOUNTER — Telehealth: Payer: Self-pay

## 2022-12-29 NOTE — Telephone Encounter (Signed)
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

## 2022-12-29 NOTE — Telephone Encounter (Signed)
Called and spoke to patient per Dr. Leonie Man note. Pt verbalized understanding. Pt had no questions at this time but was encouraged to call back if questions arise.

## 2023-01-08 ENCOUNTER — Other Ambulatory Visit: Payer: Self-pay

## 2023-01-09 DIAGNOSIS — Z419 Encounter for procedure for purposes other than remedying health state, unspecified: Secondary | ICD-10-CM | POA: Diagnosis not present

## 2023-01-12 ENCOUNTER — Other Ambulatory Visit: Payer: Self-pay

## 2023-02-09 DIAGNOSIS — Z419 Encounter for procedure for purposes other than remedying health state, unspecified: Secondary | ICD-10-CM | POA: Diagnosis not present

## 2023-03-11 DIAGNOSIS — Z419 Encounter for procedure for purposes other than remedying health state, unspecified: Secondary | ICD-10-CM | POA: Diagnosis not present

## 2023-03-24 ENCOUNTER — Other Ambulatory Visit: Payer: Self-pay | Admitting: Neurology

## 2023-03-25 NOTE — Telephone Encounter (Signed)
Requested Prescriptions   Pending Prescriptions Disp Refills   gabapentin (NEURONTIN) 100 MG capsule [Pharmacy Med Name: GABAPENTIN 100MG  CAPSULES] 60 capsule 3    Sig: TAKE 3 CAPSULES(300 MG) BY MOUTH TWICE DAILY   Last seen 12/04/22, no next appointment scheduled. Routing to provider to fill   Dispenses   Dispensed Days Supply Quantity Provider Pharmacy  GABAPENTIN 100MG  CAPSULES 01/28/2023 30 180 each Micki Riley, MD [facility] #...  GABAPENTIN 100MG  CAPSULES 01/13/2023 10 60 each Micki Riley, MD [facility] #...  GABAPENTIN 100MG  CAPSULES 12/02/2022 30 120 each Ludger Nutting, MD [facility] #...  GABAPENTIN 100MG  CAPSULES 10/19/2022 30 120 each Ludger Nutting, MD [facility] #...  GABAPENTIN  100 MG CAPS 09/21/2022 30 120 capsule Ludger Nutting, MD [facility]ORE #...  GABAPENTIN 100MG  CAPSULES 09/20/2022 30 120 each Ludger Nutting, MD [facility] #...  GABAPENTIN 100MG  CAPSULES 08/23/2022 30 120 each Ludger Nutting, MD [facility] DR[facility] STORE #...  GABAPENTIN 100MG  CAPSULES 06/30/2022 30 60 each IDICULLA,ARUN NINAN [facility] #.Marland KitchenMarland Kitchen

## 2023-04-11 DIAGNOSIS — Z419 Encounter for procedure for purposes other than remedying health state, unspecified: Secondary | ICD-10-CM | POA: Diagnosis not present

## 2023-06-22 ENCOUNTER — Other Ambulatory Visit: Payer: Self-pay | Admitting: Nurse Practitioner

## 2023-06-22 ENCOUNTER — Other Ambulatory Visit (HOSPITAL_COMMUNITY)
Admission: RE | Admit: 2023-06-22 | Discharge: 2023-06-22 | Disposition: A | Discharge status: Home / Self Care | Payer: Medicare Other | Source: Ambulatory Visit | Attending: Nurse Practitioner | Admitting: Nurse Practitioner

## 2023-06-22 DIAGNOSIS — Z1151 Encounter for screening for human papillomavirus (HPV): Secondary | ICD-10-CM | POA: Insufficient documentation

## 2023-06-22 DIAGNOSIS — Z01419 Encounter for gynecological examination (general) (routine) without abnormal findings: Secondary | ICD-10-CM | POA: Insufficient documentation

## 2023-06-22 DIAGNOSIS — Z124 Encounter for screening for malignant neoplasm of cervix: Secondary | ICD-10-CM | POA: Diagnosis present

## 2023-06-23 LAB — CYTOLOGY - PAP
Comment: NEGATIVE
Diagnosis: NEGATIVE
High risk HPV: NEGATIVE
